# Patient Record
Sex: Male | Born: 1970 | Race: White | Hispanic: No | Smoking: Current every day smoker
Health system: Southern US, Community
[De-identification: ages and names within clinical notes are randomized; demographics above are authoritative.]

## PROBLEM LIST (undated history)

## (undated) DIAGNOSIS — K5900 Constipation, unspecified: Secondary | ICD-10-CM

## (undated) DIAGNOSIS — R011 Cardiac murmur, unspecified: Secondary | ICD-10-CM

## (undated) DIAGNOSIS — J45909 Unspecified asthma, uncomplicated: Secondary | ICD-10-CM

## (undated) DIAGNOSIS — E039 Hypothyroidism, unspecified: Secondary | ICD-10-CM

## (undated) DIAGNOSIS — E079 Disorder of thyroid, unspecified: Secondary | ICD-10-CM

## (undated) DIAGNOSIS — R5383 Other fatigue: Secondary | ICD-10-CM

## (undated) DIAGNOSIS — G709 Myoneural disorder, unspecified: Secondary | ICD-10-CM

## (undated) DIAGNOSIS — F32A Depression, unspecified: Secondary | ICD-10-CM

## (undated) DIAGNOSIS — G4730 Sleep apnea, unspecified: Secondary | ICD-10-CM

## (undated) DIAGNOSIS — I341 Nonrheumatic mitral (valve) prolapse: Secondary | ICD-10-CM

## (undated) DIAGNOSIS — I10 Essential (primary) hypertension: Secondary | ICD-10-CM

## (undated) DIAGNOSIS — K469 Unspecified abdominal hernia without obstruction or gangrene: Secondary | ICD-10-CM

## (undated) DIAGNOSIS — E785 Hyperlipidemia, unspecified: Secondary | ICD-10-CM

## (undated) DIAGNOSIS — F17200 Nicotine dependence, unspecified, uncomplicated: Secondary | ICD-10-CM

## (undated) HISTORY — DX: Nicotine dependence, unspecified, uncomplicated: F17.200

## (undated) HISTORY — DX: Hyperlipidemia, unspecified: E78.5

## (undated) HISTORY — DX: Constipation, unspecified: K59.00

## (undated) HISTORY — DX: Unspecified asthma, uncomplicated: J45.909

## (undated) HISTORY — DX: Myoneural disorder, unspecified: G70.9

## (undated) HISTORY — DX: Other fatigue: R53.83

## (undated) HISTORY — PX: BRAIN SURGERY: SHX531

## (undated) HISTORY — DX: Essential (primary) hypertension: I10

## (undated) HISTORY — DX: Depression, unspecified: F32.A

## (undated) HISTORY — DX: Disorder of thyroid, unspecified: E07.9

## (undated) HISTORY — DX: Hypothyroidism, unspecified: E03.9

## (undated) HISTORY — DX: Sleep apnea, unspecified: G47.30

## (undated) HISTORY — PX: COLONOSCOPY: SHX174

## (undated) HISTORY — DX: Nonrheumatic mitral (valve) prolapse: I34.1

## (undated) HISTORY — DX: Cardiac murmur, unspecified: R01.1

---
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
## 2006-02-06 DIAGNOSIS — K469 Unspecified abdominal hernia without obstruction or gangrene: Secondary | ICD-10-CM

## 2006-02-06 HISTORY — DX: Unspecified abdominal hernia without obstruction or gangrene: K46.9

## 2010-03-24 ENCOUNTER — Emergency Department (HOSPITAL_COMMUNITY)
Admission: EM | Admit: 2010-03-24 | Discharge: 2010-03-24 | Disposition: A | Discharge status: Home / Self Care | Payer: No Typology Code available for payment source | Attending: Emergency Medicine | Admitting: Emergency Medicine

## 2010-03-24 ENCOUNTER — Emergency Department (HOSPITAL_COMMUNITY): Payer: No Typology Code available for payment source

## 2010-03-24 DIAGNOSIS — E039 Hypothyroidism, unspecified: Secondary | ICD-10-CM | POA: Insufficient documentation

## 2010-03-24 DIAGNOSIS — R079 Chest pain, unspecified: Secondary | ICD-10-CM | POA: Insufficient documentation

## 2010-03-24 DIAGNOSIS — F17200 Nicotine dependence, unspecified, uncomplicated: Secondary | ICD-10-CM | POA: Insufficient documentation

## 2010-03-24 DIAGNOSIS — S20219A Contusion of unspecified front wall of thorax, initial encounter: Secondary | ICD-10-CM | POA: Insufficient documentation

## 2013-02-26 DIAGNOSIS — G4733 Obstructive sleep apnea (adult) (pediatric): Secondary | ICD-10-CM | POA: Insufficient documentation

## 2013-02-26 DIAGNOSIS — I10 Essential (primary) hypertension: Secondary | ICD-10-CM | POA: Insufficient documentation

## 2013-02-26 DIAGNOSIS — E063 Autoimmune thyroiditis: Secondary | ICD-10-CM | POA: Insufficient documentation

## 2013-06-02 DIAGNOSIS — M5430 Sciatica, unspecified side: Secondary | ICD-10-CM | POA: Insufficient documentation

## 2015-02-07 HISTORY — PX: BACK SURGERY: SHX140

## 2016-01-26 ENCOUNTER — Ambulatory Visit (INDEPENDENT_AMBULATORY_CARE_PROVIDER_SITE_OTHER): Admitting: Orthopedic Surgery

## 2016-01-26 ENCOUNTER — Encounter (INDEPENDENT_AMBULATORY_CARE_PROVIDER_SITE_OTHER): Payer: Self-pay | Admitting: Orthopedic Surgery

## 2016-01-26 VITALS — Ht 69.0 in | Wt 236.0 lb

## 2016-01-26 DIAGNOSIS — S86111A Strain of other muscle(s) and tendon(s) of posterior muscle group at lower leg level, right leg, initial encounter: Secondary | ICD-10-CM | POA: Diagnosis not present

## 2016-01-26 NOTE — Progress Notes (Signed)
   Office Visit Note   Patient: Reginald Salazar           Date of Birth: 04/12/1970           MRN: [id number] Visit Date: 01/26/2016              Requested by: Jaynee Eagles, NP [address], [place] [address] PCP: No primary care provider on file.   Assessment & Plan: Visit Diagnoses:  1. Gastrocnemius muscle strain, right, initial encounter     Plan: Patient's Achilles is intact the gastrocnemius muscle was asymptomatic. Patient was given instructions for heel cord stretching strengthening increase his activities as tolerated over the next month.  Follow-Up Instructions: Return if symptoms worsen or fail to improve.   Orders:  No orders of the defined types were placed in this encounter.  No orders of the defined types were placed in this encounter.     Procedures: No procedures performed   Clinical Data: No additional findings.   Subjective: No chief complaint on file.   Patient presents today for calf pain. Patient was getting object out of the back of his car, heard a pop in lower calf. DOI 12/23/15. He could not walk for two days after. He was told by medical doctor that he had a tear in his calf. He was feeling better but noticed a large indentation calf muscle, a week to 10 days injury post injury. He was told by his neurosurgeon Dr. Glenna Fellows that this injury was possibly achilles injury. Bruising down at ankle. He denies any history of diabetes.     Review of Systems  Patient is approximately 5 weeks status post sprain of the medial head gastrocnemius muscle on the right of note patient is status post lumbar spine surgery for right-sided radicular numbness and weakness. Objective: Vital Signs: Ht 5\' 9"  (1.753 m)   Wt 236 lb (107 kg)   BMI 34.85 kg/m   Physical Exam examination patient is alert oriented no adenopathy well-dressed normal affect normal respiratory effort he has a normal gait he has a good dorsalis pedis and posterior tibial  pulse he has good dorsiflexion of the ankle. The Achilles tendon is palpated and is intact. The gastrocnemius muscle is nontender to palpation no palpable defect.  Ortho Exam  Specialty Comments:  No specialty comments available.  Imaging: No results found.   PMFS History: Patient Active Problem List   Diagnosis Date Noted  . Gastrocnemius muscle strain, right, initial encounter 01/26/2016   History reviewed. No pertinent past medical history.  History reviewed. No pertinent family history.  History reviewed. No pertinent surgical history. Social History   Occupational History  . Not on file.   Social History Main Topics  . Smoking status: Current Every Day Smoker    Years: 30.00    Types: Cigarettes  . Smokeless tobacco: Never Used  . Alcohol use Not on file  . Drug use: Unknown  . Sexual activity: Not on file

## 2016-04-18 DIAGNOSIS — E782 Mixed hyperlipidemia: Secondary | ICD-10-CM | POA: Insufficient documentation

## 2016-04-18 DIAGNOSIS — E039 Hypothyroidism, unspecified: Secondary | ICD-10-CM | POA: Insufficient documentation

## 2016-04-18 DIAGNOSIS — K439 Ventral hernia without obstruction or gangrene: Secondary | ICD-10-CM | POA: Insufficient documentation

## 2016-11-10 DIAGNOSIS — M25532 Pain in left wrist: Secondary | ICD-10-CM | POA: Insufficient documentation

## 2017-03-20 DIAGNOSIS — Z789 Other specified health status: Secondary | ICD-10-CM | POA: Insufficient documentation

## 2017-05-24 DIAGNOSIS — M47816 Spondylosis without myelopathy or radiculopathy, lumbar region: Secondary | ICD-10-CM | POA: Insufficient documentation

## 2017-05-24 DIAGNOSIS — M5126 Other intervertebral disc displacement, lumbar region: Secondary | ICD-10-CM | POA: Insufficient documentation

## 2017-06-13 DIAGNOSIS — E041 Nontoxic single thyroid nodule: Secondary | ICD-10-CM | POA: Insufficient documentation

## 2017-12-11 ENCOUNTER — Ambulatory Visit (INDEPENDENT_AMBULATORY_CARE_PROVIDER_SITE_OTHER): Admitting: Family Medicine

## 2017-12-11 ENCOUNTER — Encounter: Payer: Self-pay | Admitting: Family Medicine

## 2017-12-11 DIAGNOSIS — I10 Essential (primary) hypertension: Secondary | ICD-10-CM

## 2017-12-11 DIAGNOSIS — E038 Other specified hypothyroidism: Secondary | ICD-10-CM

## 2017-12-11 DIAGNOSIS — M47816 Spondylosis without myelopathy or radiculopathy, lumbar region: Secondary | ICD-10-CM | POA: Diagnosis not present

## 2017-12-11 DIAGNOSIS — E063 Autoimmune thyroiditis: Secondary | ICD-10-CM

## 2017-12-11 MED ORDER — LISINOPRIL 10 MG PO TABS: 10.0000 mg | ORAL_TABLET | Freq: Every day | ORAL | 3 refills | Status: DC

## 2017-12-11 NOTE — Assessment & Plan Note (Signed)
-  Doing well with current dose of levothyroxine -Continue and recheck in in about 4 months at CPE

## 2017-12-11 NOTE — Assessment & Plan Note (Signed)
-  BP fairly well controlled, continue current medication. Refill provided.  -Unfortunately continues to smoke as well, no interest in quitting at this time.  -Follow low sodium diet.  -Encouraged continued weight loss.

## 2017-12-11 NOTE — Patient Instructions (Addendum)
Continue medication at current dose.  I'll see you back in Feb/March

## 2017-12-11 NOTE — Assessment & Plan Note (Signed)
-  Followed by neurosurgery -Using gabapentin/percocet as needed.

## 2017-12-11 NOTE — Progress Notes (Signed)
Reginald Salazar - 47 y.o. male MRN [id number]  Date of birth: [birth year]  Subjective Chief Complaint  Patient presents with  . Medication Refill    HPI Reginald Salazar is a 47 y.o. 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  He is familiar to me from my previous practice.    -Hypothyroidism:  Diagnosed with hypothyroidism ~20 years ago, 2/2 to [place].  Has been doing fairly well up until a few months ago when started feeling a little "jittery".   Followed up with pcp at the time and TSH was suppressed.  He was referred to endocrinology and dose of medication was decreased from 253mcg daily to 125mcg daily with 1/2 tab on Sunday.  He reports that he has continued on 152mcg daily without doing a 1/2 tab on Sunday.  His recent TSH was WNL.  He has been working on weight loss using Keto diet and was told this was contributing to his fluctuating levels.  He feels well on current dose.   -HTN:  Current tx with lisinopril which has worked well for him.  Weight loss has helped keep his BP under better control as well.  He denies side effects including symptoms of hypotension.  He denies anginal symptoms, headache, vision change.    -Low back pain: History of lumbar laminectomy with fusion.  Still gets pain from time to time and uses gabapentin and percocet as needed.  Denies daily opioid use.  He continues to have numbness in his R leg but no significant loss of strength.   ROS:  A comprehensive ROS was completed and negative except as noted per HPI  No Known Allergies  Past Medical History:  Diagnosis Date  . Hypertension   . Hypothyroidism   . Sleep apnea   . Thyroid disease     Past Surgical History:  Procedure Laterality Date  . BRAIN SURGERY      Social History   Socioeconomic History  . Marital status: Single    Spouse name: Not on file  . Number of children: Not on file  . Years of  education: Not on file  . Highest education level: Not on file  Occupational History  . Not on file  Social Needs  . Financial resource strain: Not on file  . Food insecurity:    Worry: Not on file    Inability: Not on file  . Transportation needs:    Medical: Not on file    Non-medical: Not on file  Tobacco Use  . Smoking status: Current Every Day Smoker    Years: 30.00    Types: Cigarettes  . Smokeless tobacco: Never Used  Substance and Sexual Activity  . Alcohol use: Yes    Comment: rarely   . Drug use: Yes    Types: Marijuana  . Sexual activity: Not on file  Lifestyle  . Physical activity:    Days per week: Not on file    Minutes per session: Not on file  . Stress: Not on file  Relationships  . Social connections:    Talks on phone: Not on file    Gets together: Not on file    Attends religious service: Not on file    Active member of club or organization: Not on file    Attends meetings of clubs or organizations: Not on file    Relationship status: Not on file  Other Topics Concern  . Not  on file  Social History Narrative  . Not on file    Family History  Problem Relation Age of Onset  . Rheum arthritis Mother   . Mitral valve prolapse Mother     Health Maintenance  Topic Date Due  . Samul Dada  08/02/1987  . HIV Screening  12/12/2018 (Originally 08/02/1983)  . INFLUENZA VACCINE  12/26/2018 (Originally 09/06/2017)    ----------------------------------------------------------------------------------------------------------------------------------------------------------------------------------------------------------------- Physical Exam BP 130/90   Pulse 74   Temp 97.9 F (36.6 C) (Oral)   Ht 5\' 9"  (1.753 m)   Wt 233 lb (105.7 kg)   SpO2 97%   BMI 34.41 kg/m   Physical Exam  Constitutional: He is oriented to person, place, and time. He appears well-nourished. No distress.  HENT:  Head: Normocephalic and atraumatic.  Mouth/Throat: Oropharynx  is clear and moist.  Eyes: Conjunctivae are normal. No scleral icterus.  Neck: Neck supple. No thyromegaly present.  Cardiovascular: Normal rate, regular rhythm and normal heart sounds.  Pulmonary/Chest: Effort normal and breath sounds normal.  Lymphadenopathy:    He has no cervical adenopathy.  Neurological: He is alert and oriented to person, place, and time.  Skin: Skin is warm and dry.  Psychiatric: He has a normal mood and affect. His behavior is normal.    ------------------------------------------------------------------------------------------------------------------------------------------------------------------------------------------------------------------- Assessment and Plan  Hypertension -BP fairly well controlled, continue current medication. Refill provided.  -Unfortunately continues to smoke as well, no interest in quitting at this time.  -Follow low sodium diet.  -Encouraged continued weight loss.    Hypothyroidism -Doing well with current dose of levothyroxine -Continue and recheck in in about 4 months at CPE  Lumbar spondylosis -Followed by neurosurgery -Using gabapentin/percocet as needed.

## 2018-02-22 ENCOUNTER — Telehealth: Payer: Self-pay | Admitting: *Deleted

## 2018-02-22 NOTE — Telephone Encounter (Signed)
If [facility] is requesting a face to face and notes, he'll need to have an OV.

## 2018-02-22 NOTE — Telephone Encounter (Signed)
Spoke with patient-his machine has not been working properly.  CPAP machine pressure of 9 cm, verfied with patient   Copied from [place] #[id number]. Topic: General - Other >> Feb 22, 2018 11:51 AM Oneta Rack wrote:  Relation to pt: self  Call back number: [phone]  Reason for call:   Patient states C Pap machine is 51 yrs old and would like PCP to fax orders over to [facility] phone [phone] and fax # [phone]. [facility] requesting copy of face to face visit notes and new Rx reflecting why a new one is needed. Please advise patient directly

## 2018-02-22 NOTE — Telephone Encounter (Addendum)
Informed patient as directed-appointment scheduled for 03/04/2018.

## 2018-03-04 ENCOUNTER — Ambulatory Visit (INDEPENDENT_AMBULATORY_CARE_PROVIDER_SITE_OTHER): Admitting: Family Medicine

## 2018-03-04 ENCOUNTER — Encounter: Payer: Self-pay | Admitting: Family Medicine

## 2018-03-04 DIAGNOSIS — G4733 Obstructive sleep apnea (adult) (pediatric): Secondary | ICD-10-CM | POA: Diagnosis not present

## 2018-03-04 NOTE — Progress Notes (Signed)
Reginald Salazar - 48 y.o. male MRN [id number]  Date of birth: 1970-04-20  Subjective Chief Complaint  Patient presents with  . Other    Needs new CPAP machine    HPI Reginald Salazar is a 48 y.o. male with history of hypothyroidis, hypertension, and OSA here today for follow up of OSA.  He reports that his current machine is non-functioning and he needs rx for new machine.  When using machine he was using with pressure of 9 cmH20.  He has been using a [employer] nasal mask which works well for him.  He reports he is not sleeping well without having a functioning CPAP machine.  He denies headache, chest pain or shortness of breath.   ROS:  A comprehensive ROS was completed and negative except as noted per HPI   No Known Allergies  Past Medical History:  Diagnosis Date  . Hypertension   . Hypothyroidism   . Sleep apnea   . Thyroid disease     Past Surgical History:  Procedure Laterality Date  . BRAIN SURGERY      Social History   Socioeconomic History  . Marital status: Single    Spouse name: Not on file  . Number of children: Not on file  . Years of education: Not on file  . Highest education level: Not on file  Occupational History  . Not on file  Social Needs  . Financial resource strain: Not on file  . Food insecurity:    Worry: Not on file    Inability: Not on file  . Transportation needs:    Medical: Not on file    Non-medical: Not on file  Tobacco Use  . Smoking status: Current Every Day Smoker    Years: 30.00    Types: Cigarettes  . Smokeless tobacco: Never Used  Substance and Sexual Activity  . Alcohol use: Yes    Comment: rarely   . Drug use: Yes    Types: Marijuana  . Sexual activity: Not on file  Lifestyle  . Physical activity:    Days per week: Not on file    Minutes per session: Not on file  . Stress: Not on file  Relationships  . Social connections:    Talks on phone: Not on file    Gets together: Not on file    Attends  religious service: Not on file    Active member of club or organization: Not on file    Attends meetings of clubs or organizations: Not on file    Relationship status: Not on file  Other Topics Concern  . Not on file  Social History Narrative  . Not on file    Family History  Problem Relation Age of Onset  . Rheum arthritis Mother   . Mitral valve prolapse Mother     Health Maintenance  Topic Date Due  . HIV Screening  12/12/2018 (Originally 08/02/1983)  . INFLUENZA VACCINE  12/26/2018 (Originally 09/06/2017)  . TETANUS/TDAP  03/21/2027    ----------------------------------------------------------------------------------------------------------------------------------------------------------------------------------------------------------------- Physical Exam BP 128/78   Pulse 74   Temp 98.1 F (36.7 C) (Oral)   Ht 5\' 9"  (1.753 m)   Wt 240 lb (108.9 kg)   SpO2 96%   BMI 35.44 kg/m   Physical Exam Constitutional:      Appearance: Normal appearance.  Cardiovascular:     Rate and Rhythm: Normal rate and regular rhythm.  Pulmonary:     Effort: Pulmonary effort is normal.     Breath sounds:  Normal breath sounds.  Skin:    General: Skin is warm and dry.     Findings: No rash.  Neurological:     General: No focal deficit present.     Mental Status: He is alert.  Psychiatric:        Mood and Affect: Mood normal.        Behavior: Behavior normal.     ------------------------------------------------------------------------------------------------------------------------------------------------------------------------------------------------------------------- Assessment and Plan  OSA (obstructive sleep apnea) -He has done very well with CPAP, however needs new machine/equipment.  -Recommend auto-titrate machine given fluctuating weight with range of 4cmH20-12cmH20.  -Mask per preference based on fit and comfort.  -Will follow up with compliance data after 30 days.

## 2018-03-04 NOTE — Assessment & Plan Note (Signed)
-  He has done very well with CPAP, however needs new machine/equipment.  -Recommend auto-titrate machine given fluctuating weight with range of 4cmH20-12cmH20.  -Mask per preference based on fit and comfort.  -Will follow up with compliance data after 30 days.

## 2018-03-13 ENCOUNTER — Encounter: Admitting: Family Medicine

## 2018-03-15 ENCOUNTER — Encounter: Admitting: Family Medicine

## 2018-03-27 ENCOUNTER — Ambulatory Visit (INDEPENDENT_AMBULATORY_CARE_PROVIDER_SITE_OTHER): Admitting: Family Medicine

## 2018-03-27 ENCOUNTER — Ambulatory Visit (INDEPENDENT_AMBULATORY_CARE_PROVIDER_SITE_OTHER)

## 2018-03-27 ENCOUNTER — Encounter: Payer: Self-pay | Admitting: Family Medicine

## 2018-03-27 VITALS — BP 142/83 | HR 73 | Temp 97.9°F | Ht 69.0 in | Wt 237.0 lb

## 2018-03-27 DIAGNOSIS — E063 Autoimmune thyroiditis: Secondary | ICD-10-CM | POA: Diagnosis not present

## 2018-03-27 DIAGNOSIS — E038 Other specified hypothyroidism: Secondary | ICD-10-CM | POA: Diagnosis not present

## 2018-03-27 DIAGNOSIS — K5900 Constipation, unspecified: Secondary | ICD-10-CM

## 2018-03-27 DIAGNOSIS — J40 Bronchitis, not specified as acute or chronic: Secondary | ICD-10-CM | POA: Diagnosis not present

## 2018-03-27 DIAGNOSIS — J42 Unspecified chronic bronchitis: Secondary | ICD-10-CM | POA: Insufficient documentation

## 2018-03-27 DIAGNOSIS — R0781 Pleurodynia: Secondary | ICD-10-CM | POA: Diagnosis not present

## 2018-03-27 LAB — T4, FREE: FREE T4: 1.06 ng/dL (ref 0.60–1.60)

## 2018-03-27 LAB — TSH: TSH: 3.66 u[IU]/mL (ref 0.35–4.50)

## 2018-03-27 MED ORDER — ALBUTEROL SULFATE HFA 108 (90 BASE) MCG/ACT IN AERS: 2.0000 | INHALATION_SPRAY | Freq: Four times a day (QID) | RESPIRATORY_TRACT | 0 refills | Status: DC | PRN

## 2018-03-27 NOTE — Assessment & Plan Note (Signed)
Update TSH and free t4

## 2018-03-27 NOTE — Assessment & Plan Note (Signed)
Xray wit R rib detail ordered.

## 2018-03-27 NOTE — Patient Instructions (Signed)
Use albuterol as needed for cough or wheezing We'll be in touch with results.

## 2018-03-27 NOTE — Assessment & Plan Note (Signed)
Improving, he'll continue to use albuterol as needed Counseled on smoking cessation.

## 2018-03-27 NOTE — Assessment & Plan Note (Signed)
-  Increase fluids -May use laxative PRN.  -KUB ordered.

## 2018-03-27 NOTE — Progress Notes (Signed)
Reginald Salazar - 48 y.o. male MRN [id number]  Date of birth: 1970/04/04  Subjective Chief Complaint  Patient presents with  . Follow-up    thyroid  . Cough    ongoing for one week-has been having right lateral pain    HPI Reginald Salazar is a 48 y.o. male here today with complaint of R rib pain.  He reports that he had cold last week with severe episode of coughing.  States that he was coughing one evening and felt a sharp pain in his R rib area.  This has continued every time he coughs or laughs now.  It is a little better but still present.  He also reports some issues with constipation.  He has tried dulcolax and enema and gotten some improvement.   He is concerned because his BM are usually fairly regular.  He does report using a lot of OTC decongestant type medications for his cold that may be contributing.   He would like to have his thyroid checked as well.   ROS:  A comprehensive ROS was completed and negative except as noted per HPI  No Known Allergies  Past Medical History:  Diagnosis Date  . Hypertension   . Hypothyroidism   . Sleep apnea   . Thyroid disease     Past Surgical History:  Procedure Laterality Date  . BRAIN SURGERY      Social History   Socioeconomic History  . Marital status: Single    Spouse name: Not on file  . Number of children: Not on file  . Years of education: Not on file  . Highest education level: Not on file  Occupational History  . Not on file  Social Needs  . Financial resource strain: Not on file  . Food insecurity:    Worry: Not on file    Inability: Not on file  . Transportation needs:    Medical: Not on file    Non-medical: Not on file  Tobacco Use  . Smoking status: Current Every Day Smoker    Years: 30.00    Types: Cigarettes  . Smokeless tobacco: Never Used  Substance and Sexual Activity  . Alcohol use: Yes    Comment: rarely   . Drug use: Yes    Types: Marijuana  . Sexual activity: Not on file  Lifestyle  .  Physical activity:    Days per week: Not on file    Minutes per session: Not on file  . Stress: Not on file  Relationships  . Social connections:    Talks on phone: Not on file    Gets together: Not on file    Attends religious service: Not on file    Active member of club or organization: Not on file    Attends meetings of clubs or organizations: Not on file    Relationship status: Not on file  Other Topics Concern  . Not on file  Social History Narrative  . Not on file    Family History  Problem Relation Age of Onset  . Rheum arthritis Mother   . Mitral valve prolapse Mother     Health Maintenance  Topic Date Due  . HIV Screening  12/12/2018 (Originally 08/02/1983)  . INFLUENZA VACCINE  12/26/2018 (Originally 09/06/2017)  . TETANUS/TDAP  03/21/2027    ----------------------------------------------------------------------------------------------------------------------------------------------------------------------------------------------------------------- Physical Exam BP (!) 142/83   Pulse 73   Temp 97.9 F (36.6 C) (Oral)   Ht 5\' 9"  (1.753 m)   Wt 237 lb (107.5 kg)  SpO2 95%   BMI 35.00 kg/m   Physical Exam Constitutional:      Appearance: Normal appearance.  HENT:     Head: Normocephalic and atraumatic.     Right Ear: Tympanic membrane normal.     Left Ear: Tympanic membrane normal.     Mouth/Throat:     Mouth: Mucous membranes are moist.  Eyes:     General: No scleral icterus. Cardiovascular:     Rate and Rhythm: Normal rate and regular rhythm.  Pulmonary:     Effort: Pulmonary effort is normal.     Breath sounds: Normal breath sounds.  Chest:     Chest wall: Tenderness (TTP along R lower ribs) present.  Abdominal:     General: Abdomen is flat. Bowel sounds are normal. There is no distension.     Tenderness: There is no abdominal tenderness.  Skin:    General: Skin is warm and dry.     Findings: No rash.  Neurological:     General: No focal  deficit present.     Mental Status: He is alert.  Psychiatric:        Mood and Affect: Mood normal.        Behavior: Behavior normal.     ------------------------------------------------------------------------------------------------------------------------------------------------------------------------------------------------------------------- Assessment and Plan  Bronchitis Improving, he'll continue to use albuterol as needed Counseled on smoking cessation.   Hypothyroidism Update TSH and free t4  Rib pain on right side Xray wit R rib detail ordered.   Constipation -Increase fluids -May use laxative PRN.  -KUB ordered.

## 2018-05-09 ENCOUNTER — Encounter: Payer: Self-pay | Admitting: Family Medicine

## 2018-05-10 NOTE — Telephone Encounter (Signed)
Ok to schedule for webex.

## 2018-05-22 NOTE — Telephone Encounter (Signed)
Please see message under father's chart.

## 2018-05-27 ENCOUNTER — Encounter: Payer: Self-pay | Admitting: Family Medicine

## 2018-05-27 NOTE — Telephone Encounter (Signed)
HH orders entered for PT/OT/Nursing for father under his chart.  Please have him set up separate MyChart for his father's account as well.

## 2018-05-30 ENCOUNTER — Encounter: Payer: Self-pay | Admitting: Family Medicine

## 2018-05-30 DIAGNOSIS — E038 Other specified hypothyroidism: Secondary | ICD-10-CM

## 2018-05-30 DIAGNOSIS — E063 Autoimmune thyroiditis: Principal | ICD-10-CM

## 2018-05-31 MED ORDER — LEVOTHYROXINE SODIUM 175 MCG PO TABS: ORAL_TABLET | ORAL | 2 refills | Status: DC

## 2018-05-31 NOTE — Telephone Encounter (Signed)
Ok to send

## 2018-05-31 NOTE — Telephone Encounter (Signed)
Pt sent MyChart message to request Rx Levothyroxine 175Mcmg. Rx sent to [facility]. Recent labs on 03/28/2018.

## 2018-05-31 NOTE — Telephone Encounter (Signed)
Sent encounter to Dr. Zigmund Daniel for review.

## 2018-06-10 ENCOUNTER — Encounter: Payer: Self-pay | Admitting: Family Medicine

## 2018-06-10 DIAGNOSIS — E038 Other specified hypothyroidism: Secondary | ICD-10-CM

## 2018-06-10 DIAGNOSIS — E063 Autoimmune thyroiditis: Principal | ICD-10-CM

## 2018-06-11 MED ORDER — SYNTHROID 175 MCG PO TABS: ORAL_TABLET | ORAL | 2 refills | Status: DC

## 2018-06-11 NOTE — Telephone Encounter (Signed)
Pt called stated he got his Rx Levothyroxine, However , the generic , Levothyroxine causes hair loss. Pt requested Name brand. Rx sent.

## 2018-08-16 ENCOUNTER — Other Ambulatory Visit: Payer: Self-pay | Admitting: Family Medicine

## 2018-08-22 ENCOUNTER — Encounter: Payer: Self-pay | Admitting: Family Medicine

## 2018-08-22 ENCOUNTER — Other Ambulatory Visit: Payer: Self-pay

## 2018-08-22 ENCOUNTER — Telehealth (INDEPENDENT_AMBULATORY_CARE_PROVIDER_SITE_OTHER): Admitting: Family Medicine

## 2018-08-22 VITALS — Ht 69.0 in

## 2018-08-22 DIAGNOSIS — Z13220 Encounter for screening for lipoid disorders: Secondary | ICD-10-CM

## 2018-08-22 DIAGNOSIS — M47816 Spondylosis without myelopathy or radiculopathy, lumbar region: Secondary | ICD-10-CM | POA: Diagnosis not present

## 2018-08-22 DIAGNOSIS — E038 Other specified hypothyroidism: Secondary | ICD-10-CM | POA: Diagnosis not present

## 2018-08-22 DIAGNOSIS — I10 Essential (primary) hypertension: Secondary | ICD-10-CM | POA: Diagnosis not present

## 2018-08-22 DIAGNOSIS — Z79899 Other long term (current) drug therapy: Secondary | ICD-10-CM

## 2018-08-22 DIAGNOSIS — E063 Autoimmune thyroiditis: Secondary | ICD-10-CM

## 2018-08-22 NOTE — Assessment & Plan Note (Signed)
-  He will continue on percocet for management of post laminectomy pain.  Will have him complete controlled substance contract and obtain uds at when in for labs next week.

## 2018-08-22 NOTE — Assessment & Plan Note (Signed)
BP has remained well controlled with lisinopril.  Will plan to continue at current dose and update labs.

## 2018-08-22 NOTE — Progress Notes (Signed)
Reginald Salazar - 48 y.o. male MRN [id number]  Date of birth: [birth year]   This visit type was conducted due to national recommendations for restrictions regarding the COVID-19 Pandemic (e.g. social distancing).  This format is felt to be most appropriate for this patient at this time.  All issues noted in this document were discussed and addressed.  No physical exam was performed (except for noted visual exam findings with Video Visits).  I discussed the limitations of evaluation and management by telemedicine and the availability of in person appointments. The patient expressed understanding and agreed to proceed.  I connected with@ on 08/22/18 at 10:30 AM EDT by a video enabled telemedicine application and verified that I am speaking with the correct person using two identifiers.   Patient Location: Home [place] [address]   Provider location:   Home office  Chief Complaint  Patient presents with  . Back Pain    discuss pain management--lower back pain,had back surgery 3 yrs ago--neurology doctor retired.     HPI  Reginald Salazar is a 48 y.o. male who presents via audio/video conferencing for a telehealth visit today.  He has a history of HTN, hypothyroidism and chronic low back pain.  He reports that his neurosurgeon (Dr. Carloyn Manner) recently retired.  He was being treated for continued post laminectomy pain by his neurosurgeon with percocet 5/325mg  every 4-6 hours as needed.  He reports he has been doing well with this.  He denies side effects from this medication including constipation.  He would like for me to manage this since Dr. Carloyn Manner has retired.  He has tried gabapentin in the past but did not like the side effects associated with this.  Lyrica was not covered under his insurance previously but he would be willing to see if it may be covered in the future.   ROS:  A comprehensive ROS was completed and negative except as noted per HPI  Past Medical History:   Diagnosis Date  . Hypertension   . Hypothyroidism   . Sleep apnea   . Thyroid disease     Past Surgical History:  Procedure Laterality Date  . BRAIN SURGERY      Family History  Problem Relation Age of Onset  . Rheum arthritis Mother   . Mitral valve prolapse Mother     Social History   Socioeconomic History  . Marital status: Single    Spouse name: Not on file  . Number of children: Not on file  . Years of education: Not on file  . Highest education level: Not on file  Occupational History  . Not on file  Social Needs  . Financial resource strain: Not on file  . Food insecurity    Worry: Not on file    Inability: Not on file  . Transportation needs    Medical: Not on file    Non-medical: Not on file  Tobacco Use  . Smoking status: Current Every Day Smoker    Years: 30.00    Types: Cigarettes  . Smokeless tobacco: Never Used  Substance and Sexual Activity  . Alcohol use: Yes    Comment: rarely   . Drug use: Yes    Types: Marijuana  . Sexual activity: Not on file  Lifestyle  . Physical activity    Days per week: Not on file    Minutes per session: Not on file  . Stress: Not on file  Relationships  . Social connections    Talks  on phone: Not on file    Gets together: Not on file    Attends religious service: Not on file    Active member of club or organization: Not on file    Attends meetings of clubs or organizations: Not on file    Relationship status: Not on file  . Intimate partner violence    Fear of current or ex partner: Not on file    Emotionally abused: Not on file    Physically abused: Not on file    Forced sexual activity: Not on file  Other Topics Concern  . Not on file  Social History Narrative  . Not on file     Current Outpatient Medications:  .  albuterol (VENTOLIN HFA) 108 (90 Base) MCG/ACT inhaler, INHALE 2 PUFFS INTO THE LUNGS EVERY 6 HOURS AS NEEDED FOR WHEEZING OR SHORTNESS OF BREATH, Disp: 6.7 g, Rfl: 3 .  ibuprofen  (ADVIL,MOTRIN) 600 MG tablet, , Disp: , Rfl:  .  lisinopril (PRINIVIL,ZESTRIL) 10 MG tablet, Take 1 tablet (10 mg total) by mouth daily., Disp: 90 tablet, Rfl: 3 .  Misc. Devices MISC, Provide CPAP machine with pressure of 7 cm Diagnosis of OSA made 07/06/2009, Disp: , Rfl:  .  oxyCODONE-acetaminophen (PERCOCET/ROXICET) 5-325 MG tablet, , Disp: , Rfl:  .  SYNTHROID 175 MCG tablet, Take #1/2 tablet on Saturday and #1 tablet Sunday-Friday., Disp: 90 tablet, Rfl: 2  EXAM:  VITALS per patient if applicable: Ht 5\' 9"  (1.753 m)   BMI 35.00 kg/m   GENERAL: alert, oriented, appears well and in no acute distress  HEENT: atraumatic, conjunttiva clear, no obvious abnormalities on inspection of external nose and ears  NECK: normal movements of the head and neck  LUNGS: on inspection no signs of respiratory distress, breathing rate appears normal, no obvious gross SOB, gasping or wheezing  CV: no obvious cyanosis  MS: moves all visible extremities without noticeable abnormality  PSYCH/NEURO: pleasant and cooperative, no obvious depression or anxiety, speech and thought processing grossly intact  ASSESSMENT AND PLAN:  Discussed the following assessment and plan:  Hypothyroidism -Update TSH  Hypertension BP has remained well controlled with lisinopril.  Will plan to continue at current dose and update labs.    Lumbar spondylosis -He will continue on percocet for management of post laminectomy pain.  Will have him complete controlled substance contract and obtain uds at when in for labs next week.         I discussed the assessment and treatment plan with the patient. The patient was provided an opportunity to ask questions and all were answered. The patient agreed with the plan and demonstrated an understanding of the instructions.   The patient was advised to call back or seek an in-person evaluation if the symptoms worsen or if the condition fails to improve as anticipated.    Luetta Nutting, DO

## 2018-08-22 NOTE — Assessment & Plan Note (Signed)
Update TSH

## 2018-08-26 ENCOUNTER — Other Ambulatory Visit (INDEPENDENT_AMBULATORY_CARE_PROVIDER_SITE_OTHER)

## 2018-08-26 DIAGNOSIS — I10 Essential (primary) hypertension: Secondary | ICD-10-CM

## 2018-08-26 DIAGNOSIS — E063 Autoimmune thyroiditis: Secondary | ICD-10-CM

## 2018-08-26 DIAGNOSIS — M47816 Spondylosis without myelopathy or radiculopathy, lumbar region: Secondary | ICD-10-CM

## 2018-08-26 DIAGNOSIS — E038 Other specified hypothyroidism: Secondary | ICD-10-CM

## 2018-08-26 DIAGNOSIS — Z79899 Other long term (current) drug therapy: Secondary | ICD-10-CM

## 2018-08-26 DIAGNOSIS — Z13220 Encounter for screening for lipoid disorders: Secondary | ICD-10-CM

## 2018-08-26 NOTE — Addendum Note (Signed)
Addended by: Lynnea Ferrier on: 08/26/2018 11:59 AM   Modules accepted: Orders

## 2018-08-27 LAB — LIPID PANEL
Chol/HDL Ratio: 6.1 ratio — ABNORMAL HIGH (ref 0.0–5.0)
Cholesterol, Total: 249 mg/dL — ABNORMAL HIGH (ref 100–199)
HDL: 41 mg/dL (ref >39)
LDL Calculated: 141 mg/dL — ABNORMAL HIGH (ref 0–99)
Triglycerides: 336 mg/dL — ABNORMAL HIGH (ref 0–149)
VLDL Cholesterol Cal: 67 mg/dL — ABNORMAL HIGH (ref 5–40)

## 2018-08-27 LAB — BASIC METABOLIC PANEL
BUN/Creatinine Ratio: 11 (ref 9–20)
BUN: 11 mg/dL (ref 6–24)
CO2: 22 mmol/L (ref 20–29)
Calcium: 9.5 mg/dL (ref 8.7–10.2)
Chloride: 101 mmol/L (ref 96–106)
Creatinine, Ser: 0.97 mg/dL (ref 0.76–1.27)
GFR calc Af Amer: 105 mL/min/{1.73_m2} (ref >59)
GFR calc non Af Amer: 91 mL/min/{1.73_m2} (ref >59)
Glucose: 101 mg/dL — ABNORMAL HIGH (ref 65–99)
Potassium: 4.8 mmol/L (ref 3.5–5.2)
Sodium: 142 mmol/L (ref 134–144)

## 2018-08-27 LAB — T4, FREE: Free T4: 1.53 ng/dL (ref 0.82–1.77)

## 2018-08-27 LAB — TSH: TSH: 2.71 u[IU]/mL (ref 0.450–4.500)

## 2018-08-28 LAB — TOXASSURE SELECT 13 (MW), URINE

## 2018-09-03 ENCOUNTER — Encounter: Payer: Self-pay | Admitting: Family Medicine

## 2018-09-03 ENCOUNTER — Other Ambulatory Visit: Payer: Self-pay | Admitting: Family Medicine

## 2018-09-03 MED ORDER — OXYCODONE-ACETAMINOPHEN 5-325 MG PO TABS: 1.0000 | ORAL_TABLET | Freq: Four times a day (QID) | ORAL | 0 refills | Status: DC | PRN

## 2018-09-04 ENCOUNTER — Other Ambulatory Visit: Payer: Self-pay | Admitting: Family Medicine

## 2018-09-04 MED ORDER — ATORVASTATIN CALCIUM 20 MG PO TABS: 20.0000 mg | ORAL_TABLET | Freq: Every day | ORAL | 3 refills | Status: DC

## 2018-09-04 NOTE — Telephone Encounter (Signed)
Completed.

## 2018-10-10 ENCOUNTER — Encounter: Payer: Self-pay | Admitting: Family Medicine

## 2018-10-11 ENCOUNTER — Other Ambulatory Visit: Payer: Self-pay | Admitting: Family Medicine

## 2018-10-11 DIAGNOSIS — Z1211 Encounter for screening for malignant neoplasm of colon: Secondary | ICD-10-CM

## 2018-10-11 MED ORDER — OXYCODONE-ACETAMINOPHEN 5-325 MG PO TABS: 1.0000 | ORAL_TABLET | Freq: Four times a day (QID) | ORAL | 0 refills | Status: DC | PRN

## 2018-10-22 ENCOUNTER — Encounter: Payer: Self-pay | Admitting: Gastroenterology

## 2018-11-06 ENCOUNTER — Ambulatory Visit (AMBULATORY_SURGERY_CENTER): Payer: Self-pay | Admitting: *Deleted

## 2018-11-06 ENCOUNTER — Other Ambulatory Visit: Payer: Self-pay

## 2018-11-06 VITALS — Temp 97.1°F | Ht 69.5 in | Wt 224.8 lb

## 2018-11-06 DIAGNOSIS — Z1211 Encounter for screening for malignant neoplasm of colon: Secondary | ICD-10-CM

## 2018-11-06 MED ORDER — NA SULFATE-K SULFATE-MG SULF 17.5-3.13-1.6 GM/177ML PO SOLN
ORAL | 0 refills | Status: DC
Start: 2018-11-06 — End: 2018-11-20

## 2018-11-06 NOTE — Progress Notes (Signed)
Patient is here in-person for PV. Patient denies any allergies to eggs or soy. Patient denies any problems with anesthesia/sedation. Patient denies any oxygen use at home. Patient denies taking any diet/weight loss medications or blood thinners. Patient is not being treated for MRSA or C-diff. EMMI education assisgned to patient on colonoscopy, this was explained and instructions given to patient. Suprep 15 off coupon given to pt.   Pt is aware that care partner will wait in the car during procedure; if they feel like they will be too hot to wait in the car; they may wait in the lobby. Patient is aware to bring only one care partner. We want them to wear a mask (we do not have any that we can provide them), practice social distancing, and we will check their temperatures when they get here.  I did remind patient that their care partner needs to stay in the parking lot the entire time. Pt will wear mask into building.

## 2018-11-11 ENCOUNTER — Ambulatory Visit (INDEPENDENT_AMBULATORY_CARE_PROVIDER_SITE_OTHER)

## 2018-11-11 ENCOUNTER — Encounter: Payer: Self-pay | Admitting: Family Medicine

## 2018-11-11 ENCOUNTER — Ambulatory Visit (INDEPENDENT_AMBULATORY_CARE_PROVIDER_SITE_OTHER): Admitting: Family Medicine

## 2018-11-11 ENCOUNTER — Other Ambulatory Visit: Payer: Self-pay

## 2018-11-11 VITALS — BP 140/88 | HR 88 | Temp 98.7°F | Resp 16 | Ht 69.5 in | Wt 225.6 lb

## 2018-11-11 DIAGNOSIS — E063 Autoimmune thyroiditis: Secondary | ICD-10-CM

## 2018-11-11 DIAGNOSIS — M79642 Pain in left hand: Secondary | ICD-10-CM | POA: Insufficient documentation

## 2018-11-11 DIAGNOSIS — E038 Other specified hypothyroidism: Secondary | ICD-10-CM

## 2018-11-11 MED ORDER — OXYCODONE-ACETAMINOPHEN 5-325 MG PO TABS: 1.0000 | ORAL_TABLET | Freq: Four times a day (QID) | ORAL | 0 refills | Status: DC | PRN

## 2018-11-11 MED ORDER — SYNTHROID 175 MCG PO TABS: ORAL_TABLET | ORAL | 2 refills | Status: DC

## 2018-11-11 MED ORDER — LISINOPRIL 10 MG PO TABS: 10.0000 mg | ORAL_TABLET | Freq: Every day | ORAL | 3 refills | Status: DC

## 2018-11-11 NOTE — Assessment & Plan Note (Addendum)
-  xrays ordered, if fx present will refer to hand specialist.  -If imaging is negative will treat conservatively.  Wrist brace may be helpful and recommend voltaren gel.

## 2018-11-11 NOTE — Progress Notes (Signed)
Reginald Salazar - 48 y.o. male MRN [id number]  Date of birth: 01-07-1971  Subjective Chief Complaint  Patient presents with  . Hand Pain    x wednesday, Left hand, no known injury, Noticed the pain when he plays golf   . blood work    unsure if he is due for blood work    HPI Reginald Salazar is a 48 y.o. male here today due to hand pain.   Since his last visit with me his wife has unfortunately passed away from cancer.  He reports he is doing "ok" and that every day gets a little better.  He has good support from friends.  He reports that he was golfing a few days ago and noticed increased pain in the back of the L hand.  Pain is worse when using driver.  He does not notice when at rest.   He did notice a little swelling initially. He had numbness/tingling or weakness of the hand.   ROS:  A comprehensive ROS was completed and negative except as noted per HPI  No Known Allergies  Past Medical History:  Diagnosis Date  . Hyperlipidemia   . Hypertension   . Hypothyroidism   . Sleep apnea    CPAP  . Smoker   . Thyroid disease     Past Surgical History:  Procedure Laterality Date  . BACK SURGERY  2017    Social History   Socioeconomic History  . Marital status: Widowed    Spouse name: Not on file  . Number of children: Not on file  . Years of education: Not on file  . Highest education level: Not on file  Occupational History  . Not on file  Social Needs  . Financial resource strain: Not on file  . Food insecurity    Worry: Not on file    Inability: Not on file  . Transportation needs    Medical: Not on file    Non-medical: Not on file  Tobacco Use  . Smoking status: Current Every Day Smoker    Packs/day: 0.75    Years: 30.00    Pack years: 22.50    Types: Cigarettes  . Smokeless tobacco: Never Used  Substance and Sexual Activity  . Alcohol use: Yes    Alcohol/week: 12.0 standard drinks    Types: 12 Cans of beer per week  . Drug use: Not Currently  .  Sexual activity: Not on file  Lifestyle  . Physical activity    Days per week: Not on file    Minutes per session: Not on file  . Stress: Not on file  Relationships  . Social Herbalist on phone: Not on file    Gets together: Not on file    Attends religious service: Not on file    Active member of club or organization: Not on file    Attends meetings of clubs or organizations: Not on file    Relationship status: Not on file  Other Topics Concern  . Not on file  Social History Narrative  . Not on file    Family History  Problem Relation Age of Onset  . Rheum arthritis Mother   . Mitral valve prolapse Mother   . Stomach cancer Maternal Grandmother   . Colon cancer Neg Hx   . Colon polyps Neg Hx   . Esophageal cancer Neg Hx   . Rectal cancer Neg Hx     Health Maintenance  Topic Date Due  .  COLONOSCOPY  08/02/2018  . HIV Screening  12/12/2018 (Originally 08/02/1983)  . INFLUENZA VACCINE  05/07/2019 (Originally 09/07/2018)  . TETANUS/TDAP  03/21/2027    ----------------------------------------------------------------------------------------------------------------------------------------------------------------------------------------------------------------- Physical Exam BP 140/88 (BP Location: Left Arm, Patient Position: Sitting, Cuff Size: Normal)   Pulse 88   Temp 98.7 F (37.1 C) (Oral)   Resp 16   Ht 5' 9.5" (1.765 m)   Wt 225 lb 9.6 oz (102.3 kg)   SpO2 95%   BMI 32.84 kg/m   Physical Exam Constitutional:      Appearance: Normal appearance.  HENT:     Head: Normocephalic and atraumatic.  Neck:     Musculoskeletal: Neck supple.  Cardiovascular:     Rate and Rhythm: Normal rate and regular rhythm.  Pulmonary:     Effort: Pulmonary effort is normal.     Breath sounds: Normal breath sounds.  Musculoskeletal:     Comments: Mild ttp along dorsum of L hand.  Pain with resisted extension of wrist and L index finger.  Normal strength and sensation.    Skin:    General: Skin is warm and dry.  Neurological:     General: No focal deficit present.  Psychiatric:        Mood and Affect: Mood normal.     ------------------------------------------------------------------------------------------------------------------------------------------------------------------------------------------------------------------- Assessment and Plan  Left hand pain -xrays ordered, if fx present will refer to hand specialist.  -If imaging is negative will treat conservatively.  Wrist brace may be helpful and recommend voltaren gel.

## 2018-11-11 NOTE — Patient Instructions (Signed)
We'll be in touch with xray results.

## 2018-11-12 ENCOUNTER — Telehealth: Payer: Self-pay | Admitting: Family Medicine

## 2018-11-12 ENCOUNTER — Encounter: Payer: Self-pay | Admitting: Family Medicine

## 2018-11-12 ENCOUNTER — Other Ambulatory Visit: Payer: Self-pay | Admitting: Family Medicine

## 2018-11-12 MED ORDER — DICLOFENAC SODIUM 1 % TD GEL: 2.0000 g | Freq: Four times a day (QID) | TRANSDERMAL | 1 refills | Status: DC

## 2018-11-12 NOTE — Telephone Encounter (Signed)
Patient called checking on the status of the Xrays done on 11-11-2018. Please advise

## 2018-11-19 ENCOUNTER — Telehealth: Payer: Self-pay

## 2018-11-19 NOTE — Telephone Encounter (Signed)
Covid-19 screening questions   Do you now or have you had a fever in the last 14 days? NO   Do you have any respiratory symptoms of shortness of breath or cough now or in the last 14 days? NO  Do you have any family members or close contacts with diagnosed or suspected Covid-19 in the past 14 days? NO  Have you been tested for Covid-19 and found to be positive? NO        

## 2018-11-20 ENCOUNTER — Other Ambulatory Visit: Payer: Self-pay | Admitting: Gastroenterology

## 2018-11-20 ENCOUNTER — Other Ambulatory Visit: Payer: Self-pay

## 2018-11-20 ENCOUNTER — Encounter: Payer: Self-pay | Admitting: Gastroenterology

## 2018-11-20 ENCOUNTER — Ambulatory Visit (AMBULATORY_SURGERY_CENTER): Admitting: Gastroenterology

## 2018-11-20 VITALS — BP 114/83 | HR 66 | Temp 98.7°F | Resp 22 | Ht 69.5 in | Wt 224.8 lb

## 2018-11-20 DIAGNOSIS — Z1211 Encounter for screening for malignant neoplasm of colon: Secondary | ICD-10-CM | POA: Diagnosis not present

## 2018-11-20 DIAGNOSIS — K635 Polyp of colon: Secondary | ICD-10-CM

## 2018-11-20 DIAGNOSIS — D122 Benign neoplasm of ascending colon: Secondary | ICD-10-CM

## 2018-11-20 DIAGNOSIS — D123 Benign neoplasm of transverse colon: Secondary | ICD-10-CM

## 2018-11-20 DIAGNOSIS — K5730 Diverticulosis of large intestine without perforation or abscess without bleeding: Secondary | ICD-10-CM

## 2018-11-20 DIAGNOSIS — D120 Benign neoplasm of cecum: Secondary | ICD-10-CM

## 2018-11-20 MED ORDER — SODIUM CHLORIDE 0.9 % IV SOLN: 500.0000 mL | Freq: Once | INTRAVENOUS | Status: DC

## 2018-11-20 NOTE — Op Note (Signed)
Reginald Salazar Patient Name: Reginald Salazar Procedure Date: 11/20/2018 9:34 AM MRN: [id number] Endoscopist: Gerrit Heck , MD Age: 48 Referring MD:  Date of Birth: June 05, 1970 Gender: Male Account #: [id number] Procedure:                Colonoscopy Indications:              Screening for colorectal malignant neoplasm, This                            is the patient's first colonoscopy Medicines:                Monitored Anesthesia Care Procedure:                Pre-Anesthesia Assessment:                           - Prior to the procedure, a History and Physical                            was performed, and patient medications and                            allergies were reviewed. The patient's tolerance of                            previous anesthesia was also reviewed. The risks                            and benefits of the procedure and the sedation                            options and risks were discussed with the patient.                            All questions were answered, and informed consent                            was obtained. Prior Anticoagulants: The patient has                            taken no previous anticoagulant or antiplatelet                            agents. ASA Grade Assessment: II - A patient with                            mild systemic disease. After reviewing the risks                            and benefits, the patient was deemed in                            satisfactory condition to undergo the procedure.  After obtaining informed consent, the colonoscope                            was passed under direct vision. Throughout the                            procedure, the patient's blood pressure, pulse, and                            oxygen saturations were monitored continuously. The                            Colonoscope was introduced through the anus and                            advanced to the the  cecum, identified by                            appendiceal orifice and ileocecal valve. The                            colonoscopy was performed without difficulty. The                            patient tolerated the procedure well. The quality                            of the bowel preparation was adequate. The                            ileocecal valve, appendiceal orifice, and rectum                            were photographed. Scope In: 9:37:50 AM Scope Out: 9:56:07 AM Scope Withdrawal Time: 0 hours 15 minutes 59 seconds  Total Procedure Duration: 0 hours 18 minutes 17 seconds  Findings:                 The perianal and digital rectal examinations were                            normal.                           Three sessile polyps were found in the transverse                            colon, ascending colon and cecum. The polyps were 3                            to 4 mm in size. These polyps were removed with a                            cold snare. Resection and retrieval were complete.  Estimated blood loss was minimal.                           Multiple small and large-mouthed diverticula were                            found in the sigmoid colon, transverse colon,                            ascending colon and cecum.                           The retroflexed view of the distal rectum and anal                            verge was normal and showed no anal or rectal                            abnormalities. Complications:            No immediate complications. Estimated Blood Loss:     Estimated blood loss was minimal. Impression:               - Three 3 to 4 mm polyps in the transverse colon,                            in the ascending colon and in the cecum, removed                            with a cold snare. Resected and retrieved.                           - Diverticulosis in the sigmoid colon, in the                            transverse  colon, in the ascending colon and in the                            cecum.                           - The distal rectum and anal verge are normal on                            retroflexion view. Recommendation:           - Patient has a contact number available for                            emergencies. The signs and symptoms of potential                            delayed complications were discussed with the  patient. Return to normal activities tomorrow.                            Written discharge instructions were provided to the                            patient.                           - Resume previous diet.                           - Continue present medications.                           - Await pathology results.                           - Repeat colonoscopy for surveillance based on                            pathology results.                           - Return to GI clinic PRN. Gerrit Heck, MD 11/20/2018 9:59:58 AM

## 2018-11-20 NOTE — Patient Instructions (Signed)
YOU HAD AN ENDOSCOPIC PROCEDURE TODAY AT THE [facility]:   Refer to the procedure report that was given to you for any specific questions about what was found during the examination.  If the procedure report does not answer your questions, please call your gastroenterologist to clarify.  If you requested that your care partner not be given the details of your procedure findings, then the procedure report has been included in a sealed envelope for you to review at your convenience later.  YOU SHOULD EXPECT: Some feelings of bloating in the abdomen. Passage of more gas than usual.  Walking can help get rid of the air that was put into your GI tract during the procedure and reduce the bloating. If you had a lower endoscopy (such as a colonoscopy or flexible sigmoidoscopy) you may notice spotting of blood in your stool or on the toilet paper. If you underwent a bowel prep for your procedure, you may not have a normal bowel movement for a few days.  Please Note:  You might notice some irritation and congestion in your nose or some drainage.  This is from the oxygen used during your procedure.  There is no need for concern and it should clear up in a day or so.  SYMPTOMS TO REPORT IMMEDIATELY:   Following lower endoscopy (colonoscopy or flexible sigmoidoscopy):  Excessive amounts of blood in the stool  Significant tenderness or worsening of abdominal pains  Swelling of the abdomen that is new, acute  Fever of 100F or higher  For urgent or emergent issues, a gastroenterologist can be reached at any hour by calling [phone].   DIET:  We do recommend a small meal at first, but then you may proceed to your regular diet.  Drink plenty of fluids but you should avoid alcoholic beverages for 24 hours.  ACTIVITY:  You should plan to take it easy for the rest of today and you should NOT DRIVE or use heavy machinery until tomorrow (because of the sedation medicines used during the test).     FOLLOW UP: Our staff will call the number listed on your records 48-72 hours following your procedure to check on you and address any questions or concerns that you may have regarding the information given to you following your procedure. If we do not reach you, we will leave a message.  We will attempt to reach you two times.  During this call, we will ask if you have developed any symptoms of COVID 19. If you develop any symptoms (ie: fever, flu-like symptoms, shortness of breath, cough etc.) before then, please call [phone].  If you test positive for Covid 19 in the 2 weeks post procedure, please call and report this information to us.    If any biopsies were taken you will be contacted by phone or by letter within the next 1-3 weeks.  Please call us at [phone] if you have not heard about the biopsies in 3 weeks.    SIGNATURES/CONFIDENTIALITY: You and/or your care partner have signed paperwork which will be entered into your electronic medical record.  These signatures attest to the fact that that the information above on your After Visit Summary has been reviewed and is understood.  Full responsibility of the confidentiality of this discharge information lies with you and/or your care-partner. 

## 2018-11-20 NOTE — Progress Notes (Signed)
Called to room to assist during endoscopic procedure.  Patient ID and intended procedure confirmed with present staff. Received instructions for my participation in the procedure from the performing physician.  

## 2018-11-20 NOTE — Progress Notes (Signed)
Report given to PACU, vss 

## 2018-11-20 NOTE — Progress Notes (Signed)
Pt's states no medical or surgical changes since previsit or office visit. 

## 2018-11-20 NOTE — Progress Notes (Signed)
KA- Temp CW- Vitals 

## 2018-11-22 ENCOUNTER — Telehealth: Payer: Self-pay

## 2018-11-22 NOTE — Telephone Encounter (Signed)
Attempted to reach pt. With follow-up call following endoscopic procedure 11/20/2018.  LM on pt. Voice mail.  Will try to reach pt. Again later today.

## 2018-11-22 NOTE — Telephone Encounter (Signed)
  Follow up Call-  Call back number 11/20/2018  Post procedure Call Back phone  # [phone]  Permission to leave phone message Yes  Some recent data might be hidden     Patient questions:  Do you have a fever, pain , or abdominal swelling? No. Pain Score  0 *  Have you tolerated food without any problems? Yes.    Have you been able to return to your normal activities? Yes.    Do you have any questions about your discharge instructions: Diet   No. Medications  No. Follow up visit  No.  Do you have questions or concerns about your Care? No.  Actions: * If pain score is 4 or above: No action needed, pain less then 4 1. Have you developed a fever since your procedure? no  2.   Have you had an respiratory symptoms (SOB or cough) since your procedure? no  3.   Have you tested positive for COVID 19 since your procedure no  4.   Have you had any family members/close contacts diagnosed with the COVID 19 since your procedure?  no   If yes to any of these questions please route to Joylene John, RN and Alphonsa Gin, Therapist, sports.

## 2018-11-26 ENCOUNTER — Encounter: Payer: Self-pay | Admitting: Gastroenterology

## 2018-12-12 ENCOUNTER — Encounter: Payer: Self-pay | Admitting: Family Medicine

## 2018-12-13 ENCOUNTER — Other Ambulatory Visit: Payer: Self-pay | Admitting: Family Medicine

## 2018-12-13 DIAGNOSIS — M545 Low back pain, unspecified: Secondary | ICD-10-CM

## 2018-12-13 MED ORDER — OXYCODONE-ACETAMINOPHEN 5-325 MG PO TABS: 1.0000 | ORAL_TABLET | Freq: Four times a day (QID) | ORAL | 0 refills | Status: DC | PRN

## 2018-12-13 MED ORDER — ALBUTEROL SULFATE HFA 108 (90 BASE) MCG/ACT IN AERS: INHALATION_SPRAY | RESPIRATORY_TRACT | 3 refills | Status: DC

## 2019-01-22 ENCOUNTER — Other Ambulatory Visit: Payer: Self-pay | Admitting: Family Medicine

## 2019-01-22 ENCOUNTER — Encounter: Payer: Self-pay | Admitting: Family Medicine

## 2019-01-22 ENCOUNTER — Telehealth: Payer: Self-pay

## 2019-01-22 MED ORDER — OXYCODONE-ACETAMINOPHEN 5-325 MG PO TABS: 1.0000 | ORAL_TABLET | Freq: Four times a day (QID) | ORAL | 0 refills | Status: DC | PRN

## 2019-01-22 NOTE — Telephone Encounter (Signed)
Questions for Screening COVID-19   Travel or Contacts: no  During this illness, did/does the patient experience any of the following symptoms? Fever >100.67F []   Yes [x]   No []   Unknown Subjective fever (felt feverish) []   Yes [x]   No []   Unknown Chills []   Yes [x]   No []   Unknown Muscle aches (myalgia) []   Yes [x]   No []   Unknown Runny nose (rhinorrhea) []   Yes [x]   No []   Unknown Sore throat []   Yes [x]   No []   Unknown Cough (new onset or worsening of chronic cough) []   Yes [x]   No []   Unknown Shortness of breath (dyspnea) []   Yes [x]   No []   Unknown Nausea or vomiting []   Yes [x]   No []   Unknown Headache []   Yes [x]   No []   Unknown Abdominal pain  []   Yes [x]   No []   Unknown Diarrhea (?3 loose/looser than normal stools/24hr period) []   Yes [x]   No []   Unknown

## 2019-01-23 ENCOUNTER — Ambulatory Visit (INDEPENDENT_AMBULATORY_CARE_PROVIDER_SITE_OTHER)

## 2019-01-23 ENCOUNTER — Other Ambulatory Visit: Payer: Self-pay

## 2019-01-23 ENCOUNTER — Other Ambulatory Visit

## 2019-01-23 DIAGNOSIS — M545 Low back pain: Secondary | ICD-10-CM | POA: Diagnosis not present

## 2019-02-19 ENCOUNTER — Ambulatory Visit (INDEPENDENT_AMBULATORY_CARE_PROVIDER_SITE_OTHER): Admitting: Family Medicine

## 2019-02-19 ENCOUNTER — Encounter: Payer: Self-pay | Admitting: Family Medicine

## 2019-02-19 ENCOUNTER — Ambulatory Visit (INDEPENDENT_AMBULATORY_CARE_PROVIDER_SITE_OTHER)

## 2019-02-19 ENCOUNTER — Other Ambulatory Visit: Payer: Self-pay

## 2019-02-19 VITALS — BP 126/64 | HR 86 | Temp 98.7°F | Ht 69.0 in | Wt 220.6 lb

## 2019-02-19 DIAGNOSIS — R202 Paresthesia of skin: Secondary | ICD-10-CM | POA: Diagnosis not present

## 2019-02-19 DIAGNOSIS — E039 Hypothyroidism, unspecified: Secondary | ICD-10-CM

## 2019-02-19 DIAGNOSIS — M542 Cervicalgia: Secondary | ICD-10-CM | POA: Diagnosis not present

## 2019-02-19 MED ORDER — TIZANIDINE HCL 4 MG PO TABS: 4.0000 mg | ORAL_TABLET | Freq: Every day | ORAL | 0 refills | Status: DC

## 2019-02-19 NOTE — Assessment & Plan Note (Signed)
Update TSH

## 2019-02-19 NOTE — Patient Instructions (Signed)
Try muscle relaxer at bedtime.  Alternate heat/ice to jaw/temple area Let me know how things are going in a few weeks

## 2019-02-19 NOTE — Assessment & Plan Note (Signed)
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  Also may have some cervical pathology but doesn't really follow distribution of greater occipital nerve.  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  He will let me know how things are going in a couple of weeks.

## 2019-02-19 NOTE — Progress Notes (Signed)
Reginald Salazar - 49 y.o. male MRN [id number]  Date of birth: 20-Feb-1970  Subjective Chief Complaint  Patient presents with  . Pain    c/o right side of head and ear skin feels like it's crawling right side of jaw and right ear pain sometimes.    HPI Reginald Salazar is a 49 y.o. male with history of HTN, hypothyroidism, OSA and nicotine dependence here today with complaint of abnormal skin sensation along R ear/scalp.  He reports that this started a couple of weeks ago.  Describes as sensation of something crawling over his R ear and scalp with some numbness/tingling at times.  He has had issues with jaw and was told by his dentist that he clenches on the R side.  He also has some neck pain and radiation into his arms. He denies weakness, ear pain, drainage or rash.  He uses cpap and wears glasses and hasn't noticed that these make symptoms worse.    ROS:  A comprehensive ROS was completed and negative except as noted per HPI  . No Known Allergies  Past Medical History:  Diagnosis Date  . Hernia, abdominal 2008  . Hyperlipidemia   . Hypertension   . Hypothyroidism   . Sleep apnea    CPAP  . Smoker   . Thyroid disease     Past Surgical History:  Procedure Laterality Date  . BACK SURGERY  2017    Social History   Socioeconomic History  . Marital status: Widowed    Spouse name: Not on file  . Number of children: Not on file  . Years of education: Not on file  . Highest education level: Not on file  Occupational History  . Not on file  Tobacco Use  . Smoking status: Current Every Day Smoker    Packs/day: 0.75    Years: 30.00    Pack years: 22.50    Types: Cigarettes  . Smokeless tobacco: Never Used  Substance and Sexual Activity  . Alcohol use: Yes    Alcohol/week: 12.0 standard drinks    Types: 12 Cans of beer per week  . Drug use: Not Currently  . Sexual activity: Not on file  Other Topics Concern  . Not on file  Social History Narrative  . Not on file    Social Determinants of Health   Financial Resource Strain:   . Difficulty of Paying Living Expenses: Not on file  Food Insecurity:   . Worried About Charity fundraiser in the Last Year: Not on file  . Ran Out of Food in the Last Year: Not on file  Transportation Needs:   . Lack of Transportation (Medical): Not on file  . Lack of Transportation (Non-Medical): Not on file  Physical Activity:   . Days of Exercise per Week: Not on file  . Minutes of Exercise per Session: Not on file  Stress:   . Feeling of Stress : Not on file  Social Connections:   . Frequency of Communication with Friends and Family: Not on file  . Frequency of Social Gatherings with Friends and Family: Not on file  . Attends Religious Services: Not on file  . Active Member of Clubs or Organizations: Not on file  . Attends Archivist Meetings: Not on file  . Marital Status: Not on file    Family History  Problem Relation Age of Onset  . Rheum arthritis Mother   . Mitral valve prolapse Mother   . Stomach cancer Maternal Grandmother   .  Colon cancer Neg Hx   . Colon polyps Neg Hx   . Esophageal cancer Neg Hx   . Rectal cancer Neg Hx     Health Maintenance  Topic Date Due  . HIV Screening  08/02/1983  . INFLUENZA VACCINE  05/07/2019 (Originally 09/07/2018)  . COLONOSCOPY  11/19/2021  . TETANUS/TDAP  03/21/2027    ----------------------------------------------------------------------------------------------------------------------------------------------------------------------------------------------------------------- Physical Exam BP 126/64   Pulse 86   Temp 98.7 F (37.1 C) (Tympanic)   Ht 5\' 9"  (1.753 m)   Wt 220 lb 9.6 oz (100.1 kg)   SpO2 98%   BMI 32.58 kg/m   Physical Exam Constitutional:      Appearance: Normal appearance.  HENT:     Head: Normocephalic and atraumatic.     Right Ear: Tympanic membrane, ear canal and external ear normal.     Left Ear: Tympanic membrane, ear  canal and external ear normal.     Mouth/Throat:     Mouth: Mucous membranes are moist.     Comments: Mild ttp along R TMJ Eyes:     General: No scleral icterus. Cardiovascular:     Rate and Rhythm: Normal rate and regular rhythm.  Pulmonary:     Effort: Pulmonary effort is normal.     Breath sounds: Normal breath sounds.  Musculoskeletal:     Cervical back: Neck supple.     Comments: Upper trapezius is tight with ttp.  ROM of neck is normal.   Neurological:     General: No focal deficit present.     Mental Status: He is alert.  Psychiatric:        Mood and Affect: Mood normal.        Behavior: Behavior normal.     ------------------------------------------------------------------------------------------------------------------------------------------------------------------------------------------------------------------- Assessment and Plan  Facial paresthesia 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  Also may have some cervical pathology but doesn't really follow distribution of greater occipital nerve.  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  He will let me know how things are going in a couple of weeks.    Hypothyroidism Update TSH    This visit occurred during the SARS-CoV-2 public health emergency.  Safety protocols were in place, including screening questions prior to the visit, additional usage of staff PPE, and extensive cleaning of exam room while observing appropriate contact time as indicated for disinfecting solutions.

## 2019-02-20 LAB — TSH: TSH: 1.45 u[IU]/mL (ref 0.35–4.50)

## 2019-02-27 ENCOUNTER — Other Ambulatory Visit: Payer: Self-pay | Admitting: Family Medicine

## 2019-02-27 ENCOUNTER — Encounter: Payer: Self-pay | Admitting: Family Medicine

## 2019-02-27 MED ORDER — OXYCODONE-ACETAMINOPHEN 5-325 MG PO TABS: 1.0000 | ORAL_TABLET | Freq: Four times a day (QID) | ORAL | 0 refills | Status: DC | PRN

## 2019-03-31 ENCOUNTER — Encounter: Payer: Self-pay | Admitting: Family Medicine

## 2019-04-01 ENCOUNTER — Other Ambulatory Visit: Payer: Self-pay | Admitting: Family Medicine

## 2019-04-01 MED ORDER — OXYCODONE-ACETAMINOPHEN 5-325 MG PO TABS: 1.0000 | ORAL_TABLET | Freq: Four times a day (QID) | ORAL | 0 refills | Status: DC | PRN

## 2019-05-01 ENCOUNTER — Encounter: Payer: Self-pay | Admitting: Family Medicine

## 2019-05-01 NOTE — Telephone Encounter (Signed)
Routing to provider  

## 2019-05-02 ENCOUNTER — Other Ambulatory Visit: Payer: Self-pay | Admitting: Family Medicine

## 2019-05-02 MED ORDER — OXYCODONE-ACETAMINOPHEN 5-325 MG PO TABS: 1.0000 | ORAL_TABLET | Freq: Four times a day (QID) | ORAL | 0 refills | Status: DC | PRN

## 2019-06-03 ENCOUNTER — Encounter: Payer: Self-pay | Admitting: Family Medicine

## 2019-06-09 ENCOUNTER — Encounter: Payer: Self-pay | Admitting: Family Medicine

## 2019-06-09 ENCOUNTER — Other Ambulatory Visit: Payer: Self-pay

## 2019-06-09 ENCOUNTER — Ambulatory Visit (INDEPENDENT_AMBULATORY_CARE_PROVIDER_SITE_OTHER): Admitting: Family Medicine

## 2019-06-09 VITALS — BP 134/85 | HR 75 | Ht 68.9 in | Wt 215.5 lb

## 2019-06-09 DIAGNOSIS — E782 Mixed hyperlipidemia: Secondary | ICD-10-CM

## 2019-06-09 DIAGNOSIS — E038 Other specified hypothyroidism: Secondary | ICD-10-CM

## 2019-06-09 DIAGNOSIS — E063 Autoimmune thyroiditis: Secondary | ICD-10-CM | POA: Diagnosis not present

## 2019-06-09 DIAGNOSIS — M47816 Spondylosis without myelopathy or radiculopathy, lumbar region: Secondary | ICD-10-CM

## 2019-06-09 DIAGNOSIS — I10 Essential (primary) hypertension: Secondary | ICD-10-CM | POA: Diagnosis not present

## 2019-06-09 MED ORDER — SYNTHROID 175 MCG PO TABS: ORAL_TABLET | ORAL | 2 refills | Status: DC

## 2019-06-09 MED ORDER — OXYCODONE-ACETAMINOPHEN 5-325 MG PO TABS: 1.0000 | ORAL_TABLET | Freq: Four times a day (QID) | ORAL | 0 refills | Status: DC | PRN

## 2019-06-09 MED ORDER — ATORVASTATIN CALCIUM 20 MG PO TABS: 20.0000 mg | ORAL_TABLET | Freq: Every day | ORAL | 3 refills | Status: DC

## 2019-06-09 MED ORDER — LISINOPRIL 10 MG PO TABS: 10.0000 mg | ORAL_TABLET | Freq: Every day | ORAL | 3 refills | Status: DC

## 2019-06-09 NOTE — Assessment & Plan Note (Signed)
S/p laminectomy with post laminectomy pain.  PDMP reviewed.  Percocet renewed.

## 2019-06-09 NOTE — Progress Notes (Signed)
Reginald Salazar - 48 y.o. male MRN [id number]  Date of birth: 1970/06/10  Subjective Chief Complaint  Patient presents with  . Hypertension    HPI Reginald Salazar is a 49 y.o. male with history of HTN, Hypothyroidism, HLD and chronic low back pain with sciatica.  -HTN: Managed with lisinopril 10mg .  He is doing well with this.  He denies side effects.  He has not had any symptoms related to htn including chest pain, shortness of breath, palpitations, headache or vision changes.   -HLD:  Managed with lipitor 20mg  every other day.  He was taking daily however had increased muscle aches.    -Hypothyroid:  Compliant with synthroid.  Denies symptoms with hypo/hyper-thyroidism  -Low back pain:  History of chronic low back  Pain.  Has had post laminectomy pain.  This was managed by his neurosurgeon up until retirement last year.  Currently managing with percocet 5/325mg .  He continues to do well with this and denies side effects.    ROS:  A comprehensive ROS was completed and negative except as noted per HPI  No Known Allergies  Past Medical History:  Diagnosis Date  . Hernia, abdominal 2008  . Hyperlipidemia   . Hypertension   . Hypothyroidism   . Sleep apnea    CPAP  . Smoker   . Thyroid disease     Past Surgical History:  Procedure Laterality Date  . BACK SURGERY  2017    Social History   Socioeconomic History  . Marital status: Married    Spouse name: Not on file  . Number of children: Not on file  . Years of education: Not on file  . Highest education level: Not asked  Occupational History  . Occupation: Retired  Tobacco Use  . Smoking status: Current Every Day Smoker    Packs/day: 0.75    Years: 30.00    Pack years: 22.50    Types: Cigarettes  . Smokeless tobacco: Never Used  Substance and Sexual Activity  . Alcohol use: Yes    Alcohol/week: 12.0 standard drinks    Types: 12 Cans of beer per week  . Drug use: Not Currently  . Sexual activity: Yes     Partners: Female  Other Topics Concern  . Not on file  Social History Narrative  . Not on file   Social Determinants of Health   Financial Resource Strain:   . Difficulty of Paying Living Expenses:   Food Insecurity:   . Worried About Charity fundraiser in the Last Year:   . Arboriculturist in the Last Year:   Transportation Needs:   . Film/video editor (Medical):   Marland Kitchen Lack of Transportation (Non-Medical):   Physical Activity:   . Days of Exercise per Week:   . Minutes of Exercise per Session:   Stress:   . Feeling of Stress :   Social Connections:   . Frequency of Communication with Friends and Family:   . Frequency of Social Gatherings with Friends and Family:   . Attends Religious Services:   . Active Member of Clubs or Organizations:   . Attends Archivist Meetings:   Marland Kitchen Marital Status:     Family History  Problem Relation Age of Onset  . Rheum arthritis Mother   . Mitral valve prolapse Mother   . Stomach cancer Maternal Grandmother   . Colon cancer Neg Hx   . Colon polyps Neg Hx   . Esophageal cancer Neg Hx   .  Rectal cancer Neg Hx     Health Maintenance  Topic Date Due  . HIV Screening  Never done  . INFLUENZA VACCINE  09/07/2019  . COLONOSCOPY  11/19/2021  . TETANUS/TDAP  03/21/2027  . COVID-19 Vaccine  Completed     ----------------------------------------------------------------------------------------------------------------------------------------------------------------------------------------------------------------- Physical Exam BP 134/85 (BP Location: Left Arm, Patient Position: Sitting, Cuff Size: Large)   Pulse 75   Ht 5' 8.9" (1.75 m)   Wt 215 lb 8 oz (97.8 kg)   SpO2 99%   BMI 31.92 kg/m   Physical Exam Constitutional:      Appearance: Normal appearance.  HENT:     Head: Normocephalic and atraumatic.  Eyes:     General: No scleral icterus. Cardiovascular:     Rate and Rhythm: Normal rate and regular rhythm.   Pulmonary:     Effort: Pulmonary effort is normal.     Breath sounds: Normal breath sounds.  Musculoskeletal:     Cervical back: Neck supple.  Skin:    General: Skin is warm and dry.  Neurological:     Mental Status: He is alert.  Psychiatric:        Mood and Affect: Mood normal.        Behavior: Behavior normal.     ------------------------------------------------------------------------------------------------------------------------------------------------------------------------------------------------------------------- Assessment and Plan  Hypothyroidism Stable symptoms Update TSH  Mixed hyperlipidemia Taking atorvastatin qod.  Update lipid panel.   Lumbar spondylosis S/p laminectomy with post laminectomy pain.  PDMP reviewed.  Percocet renewed.   Hypertension Blood pressure is at goal at for age and co-morbidities.  I recommend he continue lisinopril.  In addition they were instructed to follow a low sodium diet with regular exercise to help to maintain adequate control of blood pressure.     Meds ordered this encounter  Medications  . atorvastatin (LIPITOR) 20 MG tablet    Sig: Take 1 tablet (20 mg total) by mouth daily.    Dispense:  90 tablet    Refill:  3  . lisinopril (ZESTRIL) 10 MG tablet    Sig: Take 1 tablet (10 mg total) by mouth daily.    Dispense:  90 tablet    Refill:  3  . SYNTHROID 175 MCG tablet    Sig: Take #1/2 tablet on Saturday and #1 tablet Sunday-Friday.    Dispense:  90 tablet    Refill:  2    Please dispense brand name of Synthroid . Levothyroxine causes hair loss.  Marland Kitchen oxyCODONE-acetaminophen (PERCOCET/ROXICET) 5-325 MG tablet    Sig: Take 1 tablet by mouth every 6 (six) hours as needed for severe pain.    Dispense:  120 tablet    Refill:  0    Return in about 6 months (around 12/10/2019) for Hypothyroid/Pain med.    This visit occurred during the SARS-CoV-2 public health emergency.  Safety protocols were in place, including  screening questions prior to the visit, additional usage of staff PPE, and extensive cleaning of exam room while observing appropriate contact time as indicated for disinfecting solutions.

## 2019-06-09 NOTE — Assessment & Plan Note (Signed)
Blood pressure is at goal at for age and co-morbidities.  I recommend he continue lisinopril.  In addition they were instructed to follow a low sodium diet with regular exercise to help to maintain adequate control of blood pressure.

## 2019-06-09 NOTE — Assessment & Plan Note (Signed)
Taking atorvastatin qod.  Update lipid panel.

## 2019-06-09 NOTE — Patient Instructions (Addendum)
Great to see you! Continue current medications.  See me again in 6 months.  

## 2019-06-09 NOTE — Assessment & Plan Note (Signed)
Stable symptoms Update TSH

## 2019-06-12 LAB — CBC
HCT: 48.2 % (ref 38.5–50.0)
Hemoglobin: 16.4 g/dL (ref 13.2–17.1)
MCH: 31.1 pg (ref 27.0–33.0)
MCHC: 34 g/dL (ref 32.0–36.0)
MCV: 91.3 fL (ref 80.0–100.0)
MPV: 9.6 fL (ref 7.5–12.5)
Platelets: 256 10*3/uL (ref 140–400)
RBC: 5.28 10*6/uL (ref 4.20–5.80)
RDW: 12.3 % (ref 11.0–15.0)
WBC: 5.2 10*3/uL (ref 3.8–10.8)

## 2019-06-12 LAB — COMPLETE METABOLIC PANEL WITH GFR
AG Ratio: 1.8 (calc) (ref 1.0–2.5)
ALT: 29 U/L (ref 9–46)
AST: 22 U/L (ref 10–35)
Albumin: 4.6 g/dL (ref 3.6–5.1)
Alkaline phosphatase (APISO): 51 U/L (ref 35–144)
BUN: 9 mg/dL (ref 7–25)
CO2: 30 mmol/L (ref 20–32)
Calcium: 9.6 mg/dL (ref 8.6–10.3)
Chloride: 101 mmol/L (ref 98–110)
Creat: 0.87 mg/dL (ref 0.70–1.33)
GFR, Est African American: 117 mL/min/{1.73_m2} (ref >=60)
GFR, Est Non African American: 101 mL/min/{1.73_m2} (ref >=60)
Globulin: 2.6 g/dL (calc) (ref 1.9–3.7)
Glucose, Bld: 96 mg/dL (ref 65–99)
Potassium: 4.8 mmol/L (ref 3.5–5.3)
Sodium: 138 mmol/L (ref 135–146)
Total Bilirubin: 0.7 mg/dL (ref 0.2–1.2)
Total Protein: 7.2 g/dL (ref 6.1–8.1)

## 2019-06-12 LAB — LIPID PANEL
Cholesterol: 237 mg/dL — ABNORMAL HIGH (ref <200)
HDL: 57 mg/dL (ref >=40)
LDL Cholesterol (Calc): 141 mg/dL (calc) — ABNORMAL HIGH
Non-HDL Cholesterol (Calc): 180 mg/dL (calc) — ABNORMAL HIGH (ref <130)
Total CHOL/HDL Ratio: 4.2 (calc) (ref <5.0)
Triglycerides: 251 mg/dL — ABNORMAL HIGH (ref <150)

## 2019-06-12 LAB — TSH: TSH: 2.25 mIU/L (ref 0.40–4.50)

## 2019-07-10 ENCOUNTER — Encounter: Payer: Self-pay | Admitting: Family Medicine

## 2019-07-11 MED ORDER — OXYCODONE-ACETAMINOPHEN 5-325 MG PO TABS: 1.0000 | ORAL_TABLET | Freq: Four times a day (QID) | ORAL | 0 refills | Status: DC | PRN

## 2019-08-13 ENCOUNTER — Encounter: Payer: Self-pay | Admitting: Family Medicine

## 2019-08-13 MED ORDER — OXYCODONE-ACETAMINOPHEN 5-325 MG PO TABS: 1.0000 | ORAL_TABLET | Freq: Four times a day (QID) | ORAL | 0 refills | Status: DC | PRN

## 2019-08-13 NOTE — Telephone Encounter (Signed)
Last written 07-11-19  Last OV states "Return in about 6 months (around 12/10/2019) for Hypothyroid/Pain med."  RX pended

## 2019-09-03 ENCOUNTER — Other Ambulatory Visit: Payer: Self-pay | Admitting: Family Medicine

## 2019-09-16 ENCOUNTER — Encounter: Payer: Self-pay | Admitting: Family Medicine

## 2019-09-16 MED ORDER — OXYCODONE-ACETAMINOPHEN 5-325 MG PO TABS: 1.0000 | ORAL_TABLET | Freq: Four times a day (QID) | ORAL | 0 refills | Status: DC | PRN

## 2019-09-16 NOTE — Telephone Encounter (Signed)
Routing to covering provider.  °

## 2019-10-20 ENCOUNTER — Other Ambulatory Visit: Payer: Self-pay

## 2019-10-20 ENCOUNTER — Encounter: Payer: Self-pay | Admitting: Family Medicine

## 2019-10-21 ENCOUNTER — Other Ambulatory Visit: Payer: Self-pay | Admitting: Family Medicine

## 2019-10-21 MED ORDER — OXYCODONE-ACETAMINOPHEN 5-325 MG PO TABS: 1.0000 | ORAL_TABLET | Freq: Four times a day (QID) | ORAL | 0 refills | Status: DC | PRN

## 2019-10-23 ENCOUNTER — Ambulatory Visit: Admitting: Family Medicine

## 2019-10-24 ENCOUNTER — Encounter: Payer: Self-pay | Admitting: Family Medicine

## 2019-10-28 ENCOUNTER — Other Ambulatory Visit: Payer: Self-pay | Admitting: Oncology

## 2019-10-28 ENCOUNTER — Ambulatory Visit: Admitting: Family Medicine

## 2019-10-28 ENCOUNTER — Encounter: Payer: Self-pay | Admitting: Oncology

## 2019-10-28 DIAGNOSIS — U071 COVID-19: Secondary | ICD-10-CM

## 2019-10-28 NOTE — Progress Notes (Signed)
I connected by phone with  Reginald Salazar to discuss the potential use of an new treatment for mild to moderate COVID-19 viral infection in non-hospitalized patients.   This patient is a age/sex that meets the FDA criteria for Emergency Use Authorization of casirivimab\imdevimab.  Has a (+) direct SARS-CoV-2 viral test result 1. Has mild or moderate COVID-19  2. Is ? 51 years of age and weighs ? 40 kg 3. Is NOT hospitalized due to COVID-19 4. Is NOT requiring oxygen therapy or requiring an increase in baseline oxygen flow rate due to COVID-19 5. Is within 10 days of symptom onset 6. Has at least one of the high risk factor(s) for progression to severe COVID-19 and/or hospitalization as defined in EUA. Specific high risk criteria : Past Medical History:  Diagnosis Date  . Hernia, abdominal 2008  . Hyperlipidemia   . Hypertension   . Hypothyroidism   . Sleep apnea    CPAP  . Smoker   . Thyroid disease   ?  ?    Symptom onset  10/25/19   I have spoken and communicated the following to the patient or parent/caregiver:   1. FDA has authorized the emergency use of casirivimab\imdevimab for the treatment of mild to moderate COVID-19 in adults and pediatric patients with positive results of direct SARS-CoV-2 viral testing who are 20 years of age and older weighing at least 40 kg, and who are at high risk for progressing to severe COVID-19 and/or hospitalization.   2. The significant known and potential risks and benefits of casirivimab\imdevimab, and the extent to which such potential risks and benefits are unknown.   3. Information on available alternative treatments and the risks and benefits of those alternatives, including clinical trials.   4. Patients treated with casirivimab\imdevimab should continue to self-isolate and use infection control measures (e.g., wear mask, isolate, social distance, avoid sharing personal items, clean and disinfect "high touch" surfaces, and frequent  handwashing) according to CDC guidelines.    5. The patient or parent/caregiver has the option to accept or refuse casirivimab\imdevimab .   After reviewing this information with the patient, The patient agreed to proceed with receiving casirivimab\imdevimab infusion and will be provided a copy of the Fact sheet prior to receiving the infusion.Rulon Abide, AGNP-C [phone] ([place])

## 2019-10-29 ENCOUNTER — Ambulatory Visit (HOSPITAL_COMMUNITY)
Admission: RE | Admit: 2019-10-29 | Discharge: 2019-10-29 | Disposition: A | Discharge status: Home / Self Care | Source: Ambulatory Visit | Attending: Pulmonary Disease | Admitting: Pulmonary Disease

## 2019-10-29 ENCOUNTER — Other Ambulatory Visit (HOSPITAL_COMMUNITY): Payer: Self-pay

## 2019-10-29 DIAGNOSIS — U071 COVID-19: Secondary | ICD-10-CM | POA: Insufficient documentation

## 2019-10-29 MED ORDER — ALBUTEROL SULFATE HFA 108 (90 BASE) MCG/ACT IN AERS: 2.0000 | INHALATION_SPRAY | Freq: Once | RESPIRATORY_TRACT | Status: DC | PRN

## 2019-10-29 MED ORDER — METHYLPREDNISOLONE SODIUM SUCC 125 MG IJ SOLR: 125.0000 mg | Freq: Once | INTRAMUSCULAR | Status: DC | PRN

## 2019-10-29 MED ORDER — SODIUM CHLORIDE 0.9 % IV SOLN
1200.0000 mg | Freq: Once | INTRAVENOUS | Status: AC
  Administered 2019-10-29: 1200 mg via INTRAVENOUS

## 2019-10-29 MED ORDER — FAMOTIDINE IN NACL 20-0.9 MG/50ML-% IV SOLN: 20.0000 mg | Freq: Once | INTRAVENOUS | Status: DC | PRN

## 2019-10-29 MED ORDER — SODIUM CHLORIDE 0.9 % IV SOLN: INTRAVENOUS | Status: DC | PRN

## 2019-10-29 MED ORDER — EPINEPHRINE 0.3 MG/0.3ML IJ SOAJ: 0.3000 mg | Freq: Once | INTRAMUSCULAR | Status: DC | PRN

## 2019-10-29 MED ORDER — DIPHENHYDRAMINE HCL 50 MG/ML IJ SOLN: 50.0000 mg | Freq: Once | INTRAMUSCULAR | Status: DC | PRN

## 2019-10-29 NOTE — Progress Notes (Signed)
  Diagnosis: COVID-19  Physician: Dr. Asencion Noble  Procedure: [facility] Med: casirivimab\imdevimab infusion - Provided patient with casirivimab\imdevimab fact sheet for patients, parents and caregivers prior to infusion.  Complications: No immediate complications noted.  Discharge: Discharged home   Gaye Alken 10/29/2019

## 2019-10-29 NOTE — Discharge Instructions (Signed)

## 2019-11-11 ENCOUNTER — Telehealth: Payer: Self-pay

## 2019-11-11 ENCOUNTER — Encounter: Payer: Self-pay | Admitting: Family Medicine

## 2019-11-11 ENCOUNTER — Ambulatory Visit (INDEPENDENT_AMBULATORY_CARE_PROVIDER_SITE_OTHER): Admitting: Family Medicine

## 2019-11-11 DIAGNOSIS — L989 Disorder of the skin and subcutaneous tissue, unspecified: Secondary | ICD-10-CM | POA: Insufficient documentation

## 2019-11-11 DIAGNOSIS — M47816 Spondylosis without myelopathy or radiculopathy, lumbar region: Secondary | ICD-10-CM

## 2019-11-11 DIAGNOSIS — I10 Essential (primary) hypertension: Secondary | ICD-10-CM

## 2019-11-11 MED ORDER — OXYCODONE-ACETAMINOPHEN 5-325 MG PO TABS
1.0000 | ORAL_TABLET | Freq: Four times a day (QID) | ORAL | 0 refills | Status: DC | PRN
Start: 2019-11-11 — End: 2020-07-27

## 2019-11-11 MED ORDER — LISINOPRIL 20 MG PO TABS
20.0000 mg | ORAL_TABLET | Freq: Every day | ORAL | 2 refills | Status: DC
Start: 2019-11-11 — End: 2020-10-06

## 2019-11-11 NOTE — Assessment & Plan Note (Signed)
Most of his skin lesions have appearance or SK, reassured.  He doesn't wish to pursue tx at this time.  Will continue to monitor for now.

## 2019-11-11 NOTE — Progress Notes (Signed)
Reginald Salazar - 49 y.o. male MRN [id number]  Date of birth: 1970-06-12  Subjective Chief Complaint  Patient presents with  . skin issues    HPI Reginald Salazar is a 49 y.o. male here today for with concern of skin lesions.  He is also following up for chronic pain.    He has a lesion on his scalp and back.  Areas are somewhat itchy.  Present for several weeks.  Denies pain or bleeding.  No prior history of skin cancer.    He continues on percocet as needed for chronic low back pain.  Has post-laminectomy pain syndrome.  Medication managed by neurosurgeon up until his retirement.  Medication continues to work well for him.  He is able to remain functional and complete household tasks.    ROS:  A comprehensive ROS was completed and negative except as noted per HPI  No Known Allergies  Past Medical History:  Diagnosis Date  . Hernia, abdominal 2008  . Hyperlipidemia   . Hypertension   . Hypothyroidism   . Sleep apnea    CPAP  . Smoker   . Thyroid disease     Past Surgical History:  Procedure Laterality Date  . BACK SURGERY  2017    Social History   Socioeconomic History  . Marital status: Married    Spouse name: Not on file  . Number of children: Not on file  . Years of education: Not on file  . Highest education level: Not asked  Occupational History  . Occupation: Retired  Tobacco Use  . Smoking status: Current Every Day Smoker    Packs/day: 0.75    Years: 30.00    Pack years: 22.50    Types: Cigarettes  . Smokeless tobacco: Never Used  Vaping Use  . Vaping Use: Never used  Substance and Sexual Activity  . Alcohol use: Yes    Alcohol/week: 12.0 standard drinks    Types: 12 Cans of beer per week  . Drug use: Not Currently  . Sexual activity: Yes    Partners: Female  Other Topics Concern  . Not on file  Social History Narrative  . Not on file   Social Determinants of Health   Financial Resource Strain:   . Difficulty of Paying Living Expenses:  Not on file  Food Insecurity:   . Worried About Charity fundraiser in the Last Year: Not on file  . Ran Out of Food in the Last Year: Not on file  Transportation Needs:   . Lack of Transportation (Medical): Not on file  . Lack of Transportation (Non-Medical): Not on file  Physical Activity:   . Days of Exercise per Week: Not on file  . Minutes of Exercise per Session: Not on file  Stress:   . Feeling of Stress : Not on file  Social Connections:   . Frequency of Communication with Friends and Family: Not on file  . Frequency of Social Gatherings with Friends and Family: Not on file  . Attends Religious Services: Not on file  . Active Member of Clubs or Organizations: Not on file  . Attends Archivist Meetings: Not on file  . Marital Status: Not on file    Family History  Problem Relation Age of Onset  . Rheum arthritis Mother   . Mitral valve prolapse Mother   . Stomach cancer Maternal Grandmother   . Colon cancer Neg Hx   . Colon polyps Neg Hx   . Esophageal cancer Neg  Hx   . Rectal cancer Neg Hx     Health Maintenance  Topic Date Due  . Hepatitis C Screening  Never done  . HIV Screening  Never done  . INFLUENZA VACCINE  Never done  . COLONOSCOPY  11/19/2021  . TETANUS/TDAP  03/21/2027  . COVID-19 Vaccine  Completed     ----------------------------------------------------------------------------------------------------------------------------------------------------------------------------------------------------------------- Physical Exam BP (!) 148/83 (BP Location: Right Arm, Patient Position: Sitting, Cuff Size: Normal)   Pulse (!) 103   Temp 98.4 F (36.9 C)   Wt 216 lb 9.6 oz (98.2 kg)   SpO2 96%   BMI 32.08 kg/m   Physical Exam Constitutional:      Appearance: Normal appearance.  Cardiovascular:     Rate and Rhythm: Normal rate and regular rhythm.  Pulmonary:     Effort: Pulmonary effort is normal.     Breath sounds: Normal breath sounds.   Skin:    Comments: Small pigmented lesion to mid back.  Raised with stuck on appearance.  Pigmentation even and symmetric.   Scaly area to r scalp.  No bleeding or ulceration.    Neurological:     General: No focal deficit present.     Mental Status: He is alert.  Psychiatric:        Mood and Affect: Mood normal.        Behavior: Behavior normal.     ------------------------------------------------------------------------------------------------------------------------------------------------------------------------------------------------------------------- Assessment and Plan  Lumbar spondylosis He continues to do well with current dosing of percocet.  Opioid risk score of 170. Updated pain contract signed today.  PDMP reviewed.  Continue opioid at current strength  Hypertension BP elevated today.  Increase lisinopril to 20mg  daily.  Recommend low sodium diet and smoking cessation.   Skin lesion Most of his skin lesions have appearance or SK, reassured.  He doesn't wish to pursue tx at this time.  Will continue to monitor for now.     Meds ordered this encounter  Medications  . lisinopril (ZESTRIL) 20 MG tablet    Sig: Take 1 tablet (20 mg total) by mouth daily.    Dispense:  90 tablet    Refill:  2  . oxyCODONE-acetaminophen (PERCOCET/ROXICET) 5-325 MG tablet    Sig: Take 1 tablet by mouth every 6 (six) hours as needed for severe pain.    Dispense:  120 tablet    Refill:  0    No follow-ups on file.    This visit occurred during the SARS-CoV-2 public health emergency.  Safety protocols were in place, including screening questions prior to the visit, additional usage of staff PPE, and extensive cleaning of exam room while observing appropriate contact time as indicated for disinfecting solutions.

## 2019-11-11 NOTE — Assessment & Plan Note (Signed)
BP elevated today.  Increase lisinopril to 20mg  daily.  Recommend low sodium diet and smoking cessation.

## 2019-11-11 NOTE — Patient Instructions (Addendum)
Continue to monitor skin lesions, let me know if you see any further changes.  Increase lisinopril to 20mg  for blood pressure.  We'll plan to follow up again in about 3 months.

## 2019-11-11 NOTE — Assessment & Plan Note (Signed)
He continues to do well with current dosing of percocet.  Opioid risk score of 170. Updated pain contract signed today.  PDMP reviewed.  Continue opioid at current strength

## 2019-11-11 NOTE — Telephone Encounter (Signed)
Opioid treatment agreement completed.   Given to [employer] for scanning.

## 2019-12-22 ENCOUNTER — Encounter: Payer: Self-pay | Admitting: Family Medicine

## 2019-12-23 ENCOUNTER — Other Ambulatory Visit: Payer: Self-pay | Admitting: Family Medicine

## 2019-12-23 MED ORDER — OXYCODONE-ACETAMINOPHEN 5-325 MG PO TABS
1.0000 | ORAL_TABLET | Freq: Four times a day (QID) | ORAL | 0 refills | Status: DC | PRN
Start: 2019-12-23 — End: 2020-11-02

## 2020-01-22 ENCOUNTER — Encounter: Payer: Self-pay | Admitting: Family Medicine

## 2020-01-23 ENCOUNTER — Other Ambulatory Visit: Payer: Self-pay | Admitting: Family Medicine

## 2020-01-23 MED ORDER — OXYCODONE-ACETAMINOPHEN 5-325 MG PO TABS
1.0000 | ORAL_TABLET | Freq: Four times a day (QID) | ORAL | 0 refills | Status: DC | PRN
Start: 2020-01-23 — End: 2020-02-18

## 2020-02-18 ENCOUNTER — Encounter: Payer: Self-pay | Admitting: Family Medicine

## 2020-02-18 ENCOUNTER — Other Ambulatory Visit: Payer: Self-pay | Admitting: Family Medicine

## 2020-02-18 MED ORDER — OXYCODONE-ACETAMINOPHEN 5-325 MG PO TABS: 1.0000 | ORAL_TABLET | Freq: Four times a day (QID) | ORAL | 0 refills | Status: DC | PRN

## 2020-03-03 ENCOUNTER — Other Ambulatory Visit: Payer: Self-pay

## 2020-03-03 ENCOUNTER — Encounter: Payer: Self-pay | Admitting: Family Medicine

## 2020-03-03 ENCOUNTER — Ambulatory Visit (INDEPENDENT_AMBULATORY_CARE_PROVIDER_SITE_OTHER): Admitting: Family Medicine

## 2020-03-03 ENCOUNTER — Other Ambulatory Visit (INDEPENDENT_AMBULATORY_CARE_PROVIDER_SITE_OTHER)

## 2020-03-03 VITALS — BP 134/83 | HR 66 | Wt 215.0 lb

## 2020-03-03 DIAGNOSIS — E782 Mixed hyperlipidemia: Secondary | ICD-10-CM | POA: Diagnosis not present

## 2020-03-03 DIAGNOSIS — E038 Other specified hypothyroidism: Secondary | ICD-10-CM | POA: Diagnosis not present

## 2020-03-03 DIAGNOSIS — M47816 Spondylosis without myelopathy or radiculopathy, lumbar region: Secondary | ICD-10-CM

## 2020-03-03 DIAGNOSIS — I10 Essential (primary) hypertension: Secondary | ICD-10-CM | POA: Diagnosis not present

## 2020-03-03 DIAGNOSIS — Z23 Encounter for immunization: Secondary | ICD-10-CM

## 2020-03-03 DIAGNOSIS — E063 Autoimmune thyroiditis: Secondary | ICD-10-CM

## 2020-03-03 DIAGNOSIS — Z125 Encounter for screening for malignant neoplasm of prostate: Secondary | ICD-10-CM

## 2020-03-03 DIAGNOSIS — Z79891 Long term (current) use of opiate analgesic: Secondary | ICD-10-CM | POA: Diagnosis not present

## 2020-03-03 LAB — COMPLETE METABOLIC PANEL WITH GFR
AG Ratio: 2 (calc) (ref 1.0–2.5)
ALT: 26 U/L (ref 9–46)
AST: 20 U/L (ref 10–35)
Albumin: 4.5 g/dL (ref 3.6–5.1)
Alkaline phosphatase (APISO): 40 U/L (ref 35–144)
BUN: 11 mg/dL (ref 7–25)
CO2: 29 mmol/L (ref 20–32)
Calcium: 9.3 mg/dL (ref 8.6–10.3)
Chloride: 99 mmol/L (ref 98–110)
Creat: 0.81 mg/dL (ref 0.70–1.33)
GFR, Est African American: 119 mL/min/{1.73_m2} (ref >=60)
GFR, Est Non African American: 103 mL/min/{1.73_m2} (ref >=60)
Globulin: 2.3 g/dL (calc) (ref 1.9–3.7)
Glucose, Bld: 101 mg/dL — ABNORMAL HIGH (ref 65–99)
Potassium: 4.5 mmol/L (ref 3.5–5.3)
Sodium: 135 mmol/L (ref 135–146)
Total Bilirubin: 0.8 mg/dL (ref 0.2–1.2)
Total Protein: 6.8 g/dL (ref 6.1–8.1)

## 2020-03-03 LAB — TSH: TSH: 0.8 mIU/L (ref 0.40–4.50)

## 2020-03-03 LAB — LIPID PANEL
Cholesterol: 245 mg/dL — ABNORMAL HIGH (ref <200)
HDL: 57 mg/dL (ref >=40)
LDL Cholesterol (Calc): 156 mg/dL (calc) — ABNORMAL HIGH
Non-HDL Cholesterol (Calc): 188 mg/dL (calc) — ABNORMAL HIGH (ref <130)
Total CHOL/HDL Ratio: 4.3 (calc) (ref <5.0)
Triglycerides: 184 mg/dL — ABNORMAL HIGH (ref <150)

## 2020-03-03 LAB — PSA: PSA: 1.02 ng/mL (ref <=4.0)

## 2020-03-03 MED ORDER — SYNTHROID 175 MCG PO TABS: ORAL_TABLET | ORAL | 2 refills | Status: DC

## 2020-03-03 NOTE — Assessment & Plan Note (Signed)
Feels good at current dose of levothyroxine.  Update TSH today.   ?

## 2020-03-03 NOTE — Patient Instructions (Signed)
Great to see you today! Have labs completed See me again in about 6 months.

## 2020-03-03 NOTE — Assessment & Plan Note (Signed)
BP remains fairly well controlled with lisinopril. Continue at current strength.  Update labs.  Continue to work on quitting smoking.

## 2020-03-03 NOTE — Assessment & Plan Note (Addendum)
Doing well with atorvastatin, continue at current strength.   Update lipid panel.

## 2020-03-03 NOTE — Assessment & Plan Note (Signed)
He continues to do well with percocet.  Updated UDS ordered.  PDMP reviewed.  Continue chronic percocet at current strength.

## 2020-03-03 NOTE — Addendum Note (Signed)
Addended by: Perlie Mayo on: 03/03/2020 08:43 AM   Modules accepted: Orders

## 2020-03-03 NOTE — Progress Notes (Signed)
Reginald Salazar - 50 y.o. male MRN [id number]  Date of birth: 1970-04-07  Subjective Chief Complaint  Patient presents with  . Hypertension  . Hypothyroidism  . Hyperlipidemia    HPI Reginald Salazar is a 50 y.o. male here today for follow up of HTN, hypothyroidism and HLD.  HTN currently managed with lisinopril 20mg  daily.  He is doing well with this and denies side effects.  BP has remained well controlled.  He has not had chest pain, shortness of breath, palpitations, headache or vision changes.  He is working on cutting back on smoking.    Tolerating atorvastatin well for management of HLD.  Denies myalgias at current strength.    Feels good at current dose of levothyroxine.  Due for updated labs.    He is taking percocet 5/325mg  q6 hours as needed for chronic low back pain.  He has history of post laminectomy pain syndrome and this is continuation of what his neurosurgeon had him on.  He is doing well with this without significant side effects.    ROS:  A comprehensive ROS was completed and negative except as noted per HPI  No Known Allergies  Past Medical History:  Diagnosis Date  . Hernia, abdominal 2008  . Hyperlipidemia   . Hypertension   . Hypothyroidism   . Sleep apnea    CPAP  . Smoker   . Thyroid disease     Past Surgical History:  Procedure Laterality Date  . BACK SURGERY  2017    Social History   Socioeconomic History  . Marital status: Married    Spouse name: Not on file  . Number of children: Not on file  . Years of education: Not on file  . Highest education level: Not asked  Occupational History  . Occupation: Retired  Tobacco Use  . Smoking status: Current Every Day Smoker    Packs/day: 0.75    Years: 30.00    Pack years: 22.50    Types: Cigarettes  . Smokeless tobacco: Never Used  Vaping Use  . Vaping Use: Never used  Substance and Sexual Activity  . Alcohol use: Yes    Alcohol/week: 12.0 standard drinks    Types: 12 Cans of beer  per week  . Drug use: Not Currently  . Sexual activity: Yes    Partners: Female  Other Topics Concern  . Not on file  Social History Narrative  . Not on file   Social Determinants of Health   Financial Resource Strain: Not on file  Food Insecurity: Not on file  Transportation Needs: Not on file  Physical Activity: Not on file  Stress: Not on file  Social Connections: Not on file    Family History  Problem Relation Age of Onset  . Rheum arthritis Mother   . Mitral valve prolapse Mother   . Stomach cancer Maternal Grandmother   . Colon cancer Neg Hx   . Colon polyps Neg Hx   . Esophageal cancer Neg Hx   . Rectal cancer Neg Hx     Health Maintenance  Topic Date Due  . Hepatitis C Screening  Never done  . HIV Screening  Never done  . INFLUENZA VACCINE  Never done  . COVID-19 Vaccine (3 - Booster for Pfizer series) 11/18/2019  . COLONOSCOPY (Pts 45-36yrs Insurance coverage will need to be confirmed)  11/19/2021  . TETANUS/TDAP  03/21/2027     ----------------------------------------------------------------------------------------------------------------------------------------------------------------------------------------------------------------- Physical Exam BP 134/83   Pulse 66   Wt 215 lb (97.5  kg)   SpO2 98%   BMI 31.84 kg/m   Physical Exam Constitutional:      Appearance: Normal appearance.  HENT:     Head: Normocephalic and atraumatic.  Eyes:     General: No scleral icterus. Cardiovascular:     Rate and Rhythm: Normal rate and regular rhythm.  Pulmonary:     Effort: Pulmonary effort is normal.     Breath sounds: Normal breath sounds.  Musculoskeletal:     Cervical back: Neck supple.  Neurological:     General: No focal deficit present.     Mental Status: He is alert.  Psychiatric:        Mood and Affect: Mood normal.        Behavior: Behavior normal.      ------------------------------------------------------------------------------------------------------------------------------------------------------------------------------------------------------------------- Assessment and Plan  Hypothyroidism Feels good at current dose of levothyroxine.  Update TSH today.   Hypertension BP remains fairly well controlled with lisinopril. Continue at current strength.  Update labs.  Continue to work on quitting smoking.    Mixed hyperlipidemia Doing well with atorvastatin, continue at current strength.   Update lipid panel.   Lumbar spondylosis He continues to do well with percocet.  Updated UDS ordered.  PDMP reviewed.  Continue chronic percocet at current strength.     Meds ordered this encounter  Medications  . SYNTHROID 175 MCG tablet    Sig: Take #1/2 tablet on Saturday and #1 tablet Sunday-Friday.    Dispense:  90 tablet    Refill:  2    Please dispense brand name of Synthroid . Levothyroxine causes hair loss.    Return in about 6 months (around 08/31/2020) for HTN/Hypothyroid.    This visit occurred during the SARS-CoV-2 public health emergency.  Safety protocols were in place, including screening questions prior to the visit, additional usage of staff PPE, and extensive cleaning of exam room while observing appropriate contact time as indicated for disinfecting solutions.

## 2020-03-04 LAB — DRUG MONITOR, PANEL 5, SCREEN, URINE
Amphetamines: NEGATIVE ng/mL (ref <500)
Barbiturates: NEGATIVE ng/mL (ref <300)
Benzodiazepines: NEGATIVE ng/mL (ref <100)
Cocaine Metabolite: NEGATIVE ng/mL (ref <150)
Creatinine: 121.4 mg/dL
Marijuana Metabolite: NEGATIVE ng/mL (ref <20)
Methadone Metabolite: NEGATIVE ng/mL (ref <100)
Opiates: POSITIVE ng/mL — AB (ref <100)
Oxidant: NEGATIVE ug/mL
Oxycodone: POSITIVE ng/mL — AB (ref <100)
pH: 6.1 (ref 4.5–9.0)

## 2020-03-04 LAB — DM TEMPLATE

## 2020-03-23 ENCOUNTER — Encounter: Payer: Self-pay | Admitting: Family Medicine

## 2020-03-24 ENCOUNTER — Other Ambulatory Visit: Payer: Self-pay | Admitting: Family Medicine

## 2020-03-24 MED ORDER — OXYCODONE-ACETAMINOPHEN 5-325 MG PO TABS
1.0000 | ORAL_TABLET | Freq: Four times a day (QID) | ORAL | 0 refills | Status: DC | PRN
Start: 2020-03-24 — End: 2020-04-23

## 2020-04-15 ENCOUNTER — Other Ambulatory Visit: Payer: Self-pay

## 2020-04-15 MED ORDER — ALBUTEROL SULFATE HFA 108 (90 BASE) MCG/ACT IN AERS: 2.0000 | INHALATION_SPRAY | Freq: Four times a day (QID) | RESPIRATORY_TRACT | 3 refills | Status: DC | PRN

## 2020-04-22 ENCOUNTER — Encounter: Payer: Self-pay | Admitting: Family Medicine

## 2020-04-23 MED ORDER — OXYCODONE-ACETAMINOPHEN 5-325 MG PO TABS
1.0000 | ORAL_TABLET | Freq: Four times a day (QID) | ORAL | 0 refills | Status: DC | PRN
Start: 2020-04-23 — End: 2020-05-24

## 2020-05-24 ENCOUNTER — Encounter: Payer: Self-pay | Admitting: Family Medicine

## 2020-05-24 MED ORDER — OXYCODONE-ACETAMINOPHEN 5-325 MG PO TABS
1.0000 | ORAL_TABLET | Freq: Four times a day (QID) | ORAL | 0 refills | Status: DC | PRN
Start: 2020-05-24 — End: 2020-06-24

## 2020-06-23 ENCOUNTER — Encounter: Payer: Self-pay | Admitting: Family Medicine

## 2020-06-24 MED ORDER — OXYCODONE-ACETAMINOPHEN 5-325 MG PO TABS: 1.0000 | ORAL_TABLET | Freq: Four times a day (QID) | ORAL | 0 refills | Status: DC | PRN

## 2020-07-15 ENCOUNTER — Ambulatory Visit

## 2020-07-19 ENCOUNTER — Ambulatory Visit (INDEPENDENT_AMBULATORY_CARE_PROVIDER_SITE_OTHER): Admitting: Sports Medicine

## 2020-07-19 ENCOUNTER — Other Ambulatory Visit: Payer: Self-pay

## 2020-07-19 VITALS — BP 129/67 | HR 84 | Resp 20 | Ht 68.9 in | Wt 216.0 lb

## 2020-07-19 DIAGNOSIS — Z23 Encounter for immunization: Secondary | ICD-10-CM

## 2020-07-19 NOTE — Progress Notes (Signed)
Established Patient Office Visit  Subjective:  Patient ID: Reginald Salazar, male    DOB: 11-03-1970  Age: 50 y.o. MRN: [id number]  CC:  Chief Complaint  Patient presents with   Immunizations    HPI Reginald Salazar presents for a shingles vaccine. Shingrix given in left deltoid. Pt tolerated well with no apparent complications.  Past Medical History:  Diagnosis Date   Hernia, abdominal 2008   Hyperlipidemia    Hypertension    Hypothyroidism    Sleep apnea    CPAP   Smoker    Thyroid disease     Past Surgical History:  Procedure Laterality Date   BACK SURGERY  2017    Family History  Problem Relation Age of Onset   Rheum arthritis Mother    Mitral valve prolapse Mother    Stomach cancer Maternal Grandmother    Colon cancer Neg Hx    Colon polyps Neg Hx    Esophageal cancer Neg Hx    Rectal cancer Neg Hx     Social History   Socioeconomic History   Marital status: Married    Spouse name: Not on file   Number of children: Not on file   Years of education: Not on file   Highest education level: Not asked  Occupational History   Occupation: Retired  Tobacco Use   Smoking status: Every Day    Packs/day: 0.75    Years: 30.00    Pack years: 22.50    Types: Cigarettes   Smokeless tobacco: Never  Vaping Use   Vaping Use: Never used  Substance and Sexual Activity   Alcohol use: Yes    Alcohol/week: 12.0 standard drinks    Types: 12 Cans of beer per week   Drug use: Not Currently   Sexual activity: Yes    Partners: Female  Other Topics Concern   Not on file  Social History Narrative   Not on file   Social Determinants of Health   Financial Resource Strain: Not on file  Food Insecurity: Not on file  Transportation Needs: Not on file  Physical Activity: Not on file  Stress: Not on file  Social Connections: Not on file  Intimate Partner Violence: Not on file    Outpatient Medications Prior to Visit  Medication Sig Dispense Refill    albuterol (VENTOLIN HFA) 108 (90 Base) MCG/ACT inhaler Inhale 2 puffs into the lungs every 6 (six) hours as needed for wheezing or shortness of breath. 6.7 g 3   atorvastatin (LIPITOR) 20 MG tablet Take 1 tablet (20 mg total) by mouth daily. 90 tablet 3   diclofenac sodium (VOLTAREN) 1 % GEL Apply 2 g topically 4 (four) times daily. 150 g 1   lisinopril (ZESTRIL) 20 MG tablet Take 1 tablet (20 mg total) by mouth daily. 90 tablet 2   Multiple Vitamin (MULTIVITAMIN) tablet Take 1 tablet by mouth daily.     oxyCODONE-acetaminophen (PERCOCET/ROXICET) 5-325 MG tablet Take 1 tablet by mouth every 6 (six) hours as needed for severe pain. 120 tablet 0   SYNTHROID 175 MCG tablet Take #1/2 tablet on Saturday and #1 tablet Sunday-Friday. 90 tablet 2   No facility-administered medications prior to visit.    No Known Allergies  ROS Review of Systems    Objective:    Physical Exam  BP 129/67 (BP Location: Left Arm, Patient Position: Sitting, Cuff Size: Large)   Pulse 84   Resp 20   Ht 5' 8.9" (1.75 m)   Wt 216  lb (98 kg)   SpO2 99%   BMI 31.99 kg/m  Wt Readings from Last 3 Encounters:  07/19/20 216 lb (98 kg)  03/03/20 215 lb (97.5 kg)  11/11/19 216 lb 9.6 oz (98.2 kg)     Health Maintenance Due  Topic Date Due   Pneumococcal Vaccine 3-86 Years old (1 - PCV) Never done   HIV Screening  Never done   Hepatitis C Screening  Never done   COVID-19 Vaccine (3 - Pfizer risk series) 06/16/2019    There are no preventive care reminders to display for this patient.  Lab Results  Component Value Date   TSH 0.80 03/03/2020   Lab Results  Component Value Date   WBC 5.2 06/12/2019   HGB 16.4 06/12/2019   HCT 48.2 06/12/2019   MCV 91.3 06/12/2019   PLT 256 06/12/2019   Lab Results  Component Value Date   NA 135 03/03/2020   K 4.5 03/03/2020   CO2 29 03/03/2020   GLUCOSE 101 (H) 03/03/2020   BUN 11 03/03/2020   CREATININE 0.81 03/03/2020   BILITOT 0.8 03/03/2020   AST 20  03/03/2020   ALT 26 03/03/2020   PROT 6.8 03/03/2020   CALCIUM 9.3 03/03/2020   Lab Results  Component Value Date   CHOL 245 (H) 03/03/2020   Lab Results  Component Value Date   HDL 57 03/03/2020   Lab Results  Component Value Date   LDLCALC 156 (H) 03/03/2020   Lab Results  Component Value Date   TRIG 184 (H) 03/03/2020   Lab Results  Component Value Date   CHOLHDL 4.3 03/03/2020   No results found for: HGBA1C    Assessment & Plan:  Shingrix given in left deltoid. Pt tolerated well with no apparent complications. Problem List Items Addressed This Visit   None Visit Diagnoses     Need for shingles vaccine    -  Primary   Relevant Orders   Varicella-zoster vaccine IM (Shingrix) (Completed)       No orders of the defined types were placed in this encounter.   Follow-up: No follow-ups on file.    Ninfa Meeker, CMA

## 2020-07-26 ENCOUNTER — Encounter: Payer: Self-pay | Admitting: Family Medicine

## 2020-07-27 ENCOUNTER — Other Ambulatory Visit: Payer: Self-pay | Admitting: Family Medicine

## 2020-07-27 MED ORDER — OXYCODONE-ACETAMINOPHEN 5-325 MG PO TABS
1.0000 | ORAL_TABLET | Freq: Four times a day (QID) | ORAL | 0 refills | Status: DC | PRN
Start: 2020-07-27 — End: 2020-08-23

## 2020-08-11 ENCOUNTER — Encounter: Payer: Self-pay | Admitting: Family Medicine

## 2020-08-12 ENCOUNTER — Other Ambulatory Visit: Payer: Self-pay | Admitting: *Deleted

## 2020-08-12 MED ORDER — ALBUTEROL SULFATE HFA 108 (90 BASE) MCG/ACT IN AERS: 2.0000 | INHALATION_SPRAY | Freq: Four times a day (QID) | RESPIRATORY_TRACT | 3 refills | Status: DC | PRN

## 2020-08-23 ENCOUNTER — Other Ambulatory Visit: Payer: Self-pay | Admitting: Family Medicine

## 2020-08-24 MED ORDER — OXYCODONE-ACETAMINOPHEN 5-325 MG PO TABS
1.0000 | ORAL_TABLET | Freq: Four times a day (QID) | ORAL | 0 refills | Status: DC | PRN
Start: 2020-08-24 — End: 2020-09-27

## 2020-09-26 ENCOUNTER — Encounter: Payer: Self-pay | Admitting: Family Medicine

## 2020-09-28 MED ORDER — OXYCODONE-ACETAMINOPHEN 5-325 MG PO TABS
1.0000 | ORAL_TABLET | Freq: Four times a day (QID) | ORAL | 0 refills | Status: DC | PRN
Start: 2020-09-28 — End: 2020-10-28

## 2020-09-28 NOTE — Telephone Encounter (Signed)
Left voicemail message for patient to call back to get this appointment scheduled. AM

## 2020-09-28 NOTE — Telephone Encounter (Signed)
Rx sent in.  Due for follow up, please schedule.  Thanks!  CM

## 2020-10-05 DIAGNOSIS — M1990 Unspecified osteoarthritis, unspecified site: Secondary | ICD-10-CM | POA: Insufficient documentation

## 2020-10-05 DIAGNOSIS — F32A Depression, unspecified: Secondary | ICD-10-CM | POA: Insufficient documentation

## 2020-10-05 DIAGNOSIS — I059 Rheumatic mitral valve disease, unspecified: Secondary | ICD-10-CM | POA: Insufficient documentation

## 2020-10-05 DIAGNOSIS — F1010 Alcohol abuse, uncomplicated: Secondary | ICD-10-CM | POA: Insufficient documentation

## 2020-10-06 ENCOUNTER — Ambulatory Visit (INDEPENDENT_AMBULATORY_CARE_PROVIDER_SITE_OTHER): Admitting: Family Medicine

## 2020-10-06 ENCOUNTER — Encounter: Payer: Self-pay | Admitting: Family Medicine

## 2020-10-06 ENCOUNTER — Other Ambulatory Visit: Payer: Self-pay

## 2020-10-06 VITALS — BP 124/84 | HR 80 | Temp 97.8°F | Ht 68.0 in | Wt 211.5 lb

## 2020-10-06 DIAGNOSIS — E782 Mixed hyperlipidemia: Secondary | ICD-10-CM

## 2020-10-06 DIAGNOSIS — M47816 Spondylosis without myelopathy or radiculopathy, lumbar region: Secondary | ICD-10-CM | POA: Diagnosis not present

## 2020-10-06 DIAGNOSIS — I10 Essential (primary) hypertension: Secondary | ICD-10-CM | POA: Diagnosis not present

## 2020-10-06 DIAGNOSIS — E038 Other specified hypothyroidism: Secondary | ICD-10-CM

## 2020-10-06 DIAGNOSIS — E063 Autoimmune thyroiditis: Secondary | ICD-10-CM

## 2020-10-06 MED ORDER — PREDNISONE 50 MG PO TABS: ORAL_TABLET | ORAL | 0 refills | Status: DC

## 2020-10-06 MED ORDER — LISINOPRIL 20 MG PO TABS
20.0000 mg | ORAL_TABLET | Freq: Every day | ORAL | 2 refills | Status: DC
Start: 2020-10-06 — End: 2020-10-14

## 2020-10-06 NOTE — Assessment & Plan Note (Signed)
Continues to do well with Percocet at current strength.  No side effects at this time.  He has been able to cut back on use of this some.  PDMP reviewed with opioid risk score of 160.

## 2020-10-06 NOTE — Progress Notes (Signed)
Reginald Salazar - 50 y.o. male MRN [id number]  Date of birth: [birth year]  Subjective Chief Complaint  Patient presents with   Back Pain    f   Leg Pain   Immunizations    HPI Reginald Salazar is a 50 year old male here today for follow-up visit.  He has a history of hypertension, hyperlipidemia hypothyroidism as well as lumbar spondylosis.  He is treated with lisinopril for management of hypertension.  He is doing well with this with good control of blood pressure.  He denies side effects related to current medication.  He has not had chest pain, shortness of breath, palpitations, headache or vision changes.  He continues with levothyroxine 175 mcg daily.  He feels like this is working well for him.  He is due for updated thyroid testing.  He has had elevated lipids and is prescribed atorvastatin however has not been taking this.  He continues on oxycodone as needed for management of continued low back pain.  He is not having any side effects from Percocet.  This continues to work well for management of his pain.  He has been able to cut back on this some.  ROS:  A comprehensive ROS was completed and negative except as noted per HPI  No Known Allergies  Past Medical History:  Diagnosis Date   Hernia, abdominal 2008   Hyperlipidemia    Hypertension    Hypothyroidism    Sleep apnea    CPAP   Smoker    Thyroid disease     Past Surgical History:  Procedure Laterality Date   BACK SURGERY  2017    Social History   Socioeconomic History   Marital status: Married    Spouse name: Not on file   Number of children: Not on file   Years of education: Not on file   Highest education level: Not asked  Occupational History   Occupation: Retired  Tobacco Use   Smoking status: Every Day    Packs/day: 0.75    Years: 30.00    Pack years: 22.50    Types: Cigarettes   Smokeless tobacco: Never  Vaping Use   Vaping Use: Never used  Substance and Sexual Activity   Alcohol use: Yes     Alcohol/week: 12.0 standard drinks    Types: 12 Cans of beer per week   Drug use: Not Currently   Sexual activity: Yes    Partners: Female  Other Topics Concern   Not on file  Social History Narrative   Not on file   Social Determinants of Health   Financial Resource Strain: Not on file  Food Insecurity: Not on file  Transportation Needs: Not on file  Physical Activity: Not on file  Stress: Not on file  Social Connections: Not on file    Family History  Problem Relation Age of Onset   Rheum arthritis Mother    Mitral valve prolapse Mother    Stomach cancer Maternal Grandmother    Colon cancer Neg Hx    Colon polyps Neg Hx    Esophageal cancer Neg Hx    Rectal cancer Neg Hx     Health Maintenance  Topic Date Due   HIV Screening  Never done   Hepatitis C Screening  Never done   Pneumococcal Vaccine 37-22 Years old (2 - PCV) 03/20/2018   COVID-19 Vaccine (3 - Pfizer risk series) 06/16/2019   INFLUENZA VACCINE  05/06/2021 (Originally 09/06/2020)   COLONOSCOPY (Pts 45-80yr Insurance coverage will need to be confirmed)  11/19/2021   TETANUS/TDAP  03/21/2027   Zoster Vaccines- Shingrix  Completed   HPV VACCINES  Aged Out     ----------------------------------------------------------------------------------------------------------------------------------------------------------------------------------------------------------------- Physical Exam BP 124/84 (BP Location: Left Arm, Patient Position: Sitting, Cuff Size: Normal)   Pulse 80   Temp 97.8 F (36.6 C)   Ht '5\' 8"'$  (1.727 m)   Wt 211 lb 8 oz (95.9 kg)   SpO2 99%   BMI 32.16 kg/m   Physical Exam Constitutional:      Appearance: Normal appearance.  HENT:     Head: Normocephalic and atraumatic.  Eyes:     General: No scleral icterus. Cardiovascular:     Rate and Rhythm: Normal rate and regular rhythm.  Pulmonary:     Effort: Pulmonary effort is normal.     Breath sounds: Normal breath sounds.   Musculoskeletal:     Cervical back: Neck supple.  Neurological:     General: No focal deficit present.     Mental Status: He is alert.  Psychiatric:        Mood and Affect: Mood normal.        Behavior: Behavior normal.    ------------------------------------------------------------------------------------------------------------------------------------------------------------------------------------------------------------------- Assessment and Plan  Hypertension Blood pressure remains well controlled with lisinopril.  Recommend continuation of current strength.  Hypothyroidism Update TSH today.  Lumbar spondylosis Continues to do well with Percocet at current strength.  No side effects at this time.  He has been able to cut back on use of this some.  PDMP reviewed with opioid risk score of 160.  Mixed hyperlipidemia Prescribed atorvastatin however has not taken.  Update lipid panel.   Meds ordered this encounter  Medications   lisinopril (ZESTRIL) 20 MG tablet    Sig: Take 1 tablet (20 mg total) by mouth daily.    Dispense:  90 tablet    Refill:  2   predniSONE (DELTASONE) 50 MG tablet    Sig: Take 1 tab po daily x5 days    Dispense:  5 tablet    Refill:  0    Return in about 6 months (around 04/05/2021) for HTN/Back pain.    This visit occurred during the SARS-CoV-2 public health emergency.  Safety protocols were in place, including screening questions prior to the visit, additional usage of staff PPE, and extensive cleaning of exam room while observing appropriate contact time as indicated for disinfecting solutions.

## 2020-10-06 NOTE — Assessment & Plan Note (Signed)
Update TSH today. 

## 2020-10-06 NOTE — Assessment & Plan Note (Signed)
Blood pressure remains well controlled with lisinopril.  Recommend continuation of current strength.

## 2020-10-06 NOTE — Assessment & Plan Note (Signed)
Prescribed atorvastatin however has not taken.  Update lipid panel.

## 2020-10-07 LAB — COMPLETE METABOLIC PANEL WITH GFR
AG Ratio: 1.8 (calc) (ref 1.0–2.5)
ALT: 21 U/L (ref 9–46)
AST: 19 U/L (ref 10–35)
Albumin: 4.8 g/dL (ref 3.6–5.1)
Alkaline phosphatase (APISO): 48 U/L (ref 35–144)
BUN: 10 mg/dL (ref 7–25)
CO2: 30 mmol/L (ref 20–32)
Calcium: 9.6 mg/dL (ref 8.6–10.3)
Chloride: 99 mmol/L (ref 98–110)
Creat: 0.87 mg/dL (ref 0.70–1.30)
Globulin: 2.6 g/dL (calc) (ref 1.9–3.7)
Glucose, Bld: 106 mg/dL — ABNORMAL HIGH (ref 65–99)
Potassium: 4.7 mmol/L (ref 3.5–5.3)
Sodium: 136 mmol/L (ref 135–146)
Total Bilirubin: 0.8 mg/dL (ref 0.2–1.2)
Total Protein: 7.4 g/dL (ref 6.1–8.1)
eGFR: 104 mL/min/{1.73_m2} (ref >=60)

## 2020-10-07 LAB — LIPID PANEL W/REFLEX DIRECT LDL
Cholesterol: 263 mg/dL — ABNORMAL HIGH (ref <200)
HDL: 67 mg/dL (ref >=40)
LDL Cholesterol (Calc): 170 mg/dL (calc) — ABNORMAL HIGH
Non-HDL Cholesterol (Calc): 196 mg/dL (calc) — ABNORMAL HIGH (ref <130)
Total CHOL/HDL Ratio: 3.9 (calc) (ref <5.0)
Triglycerides: 126 mg/dL (ref <150)

## 2020-10-07 LAB — TSH: TSH: 4.03 mIU/L (ref 0.40–4.50)

## 2020-10-14 ENCOUNTER — Other Ambulatory Visit: Payer: Self-pay

## 2020-10-14 MED ORDER — LISINOPRIL 20 MG PO TABS
20.0000 mg | ORAL_TABLET | Freq: Every day | ORAL | 0 refills | Status: DC
Start: 2020-10-14 — End: 2021-03-02

## 2020-10-14 NOTE — Telephone Encounter (Signed)
Received fax from [facility] requesting RX for lisinopril to be filled thru their pharmacy rather than from local pharmacy.  [facility].  Charyl Bigger, CMA

## 2020-11-01 ENCOUNTER — Encounter: Payer: Self-pay | Admitting: Family Medicine

## 2020-11-01 NOTE — Telephone Encounter (Signed)
Pt is requesting "pain meds".  He was previously on oxycodone.  Please review request.  Charyl Bigger, CMA

## 2020-11-02 ENCOUNTER — Other Ambulatory Visit: Payer: Self-pay | Admitting: Family Medicine

## 2020-11-02 MED ORDER — OXYCODONE-ACETAMINOPHEN 5-325 MG PO TABS: 1.0000 | ORAL_TABLET | Freq: Four times a day (QID) | ORAL | 0 refills | Status: DC | PRN

## 2020-11-25 ENCOUNTER — Other Ambulatory Visit: Payer: Self-pay | Admitting: Family Medicine

## 2020-11-30 ENCOUNTER — Encounter: Payer: Self-pay | Admitting: Family Medicine

## 2020-11-30 MED ORDER — OXYCODONE-ACETAMINOPHEN 5-325 MG PO TABS
1.0000 | ORAL_TABLET | Freq: Four times a day (QID) | ORAL | 0 refills | Status: DC | PRN
Start: 2020-11-30 — End: 2020-12-30

## 2021-01-03 ENCOUNTER — Encounter: Payer: Self-pay | Admitting: Family Medicine

## 2021-01-04 MED ORDER — OXYCODONE-ACETAMINOPHEN 5-325 MG PO TABS
1.0000 | ORAL_TABLET | Freq: Four times a day (QID) | ORAL | 0 refills | Status: DC | PRN
Start: 2021-01-04 — End: 2021-02-03

## 2021-01-04 NOTE — Telephone Encounter (Signed)
Rx renewed

## 2021-01-10 IMAGING — DX DG HAND COMPLETE 3+V*L*
3 series · 3 of 3 positions shown · non-contrast
Comparison: None.

CLINICAL DATA: Initial visit for left hand pain. Pt identifies
worse pain at lateral palm between 1st and 2nd digit. Pt states he
may have injured it playing golf.

EXAM:
LEFT HAND - COMPLETE 3+ VIEW

[hand pa]
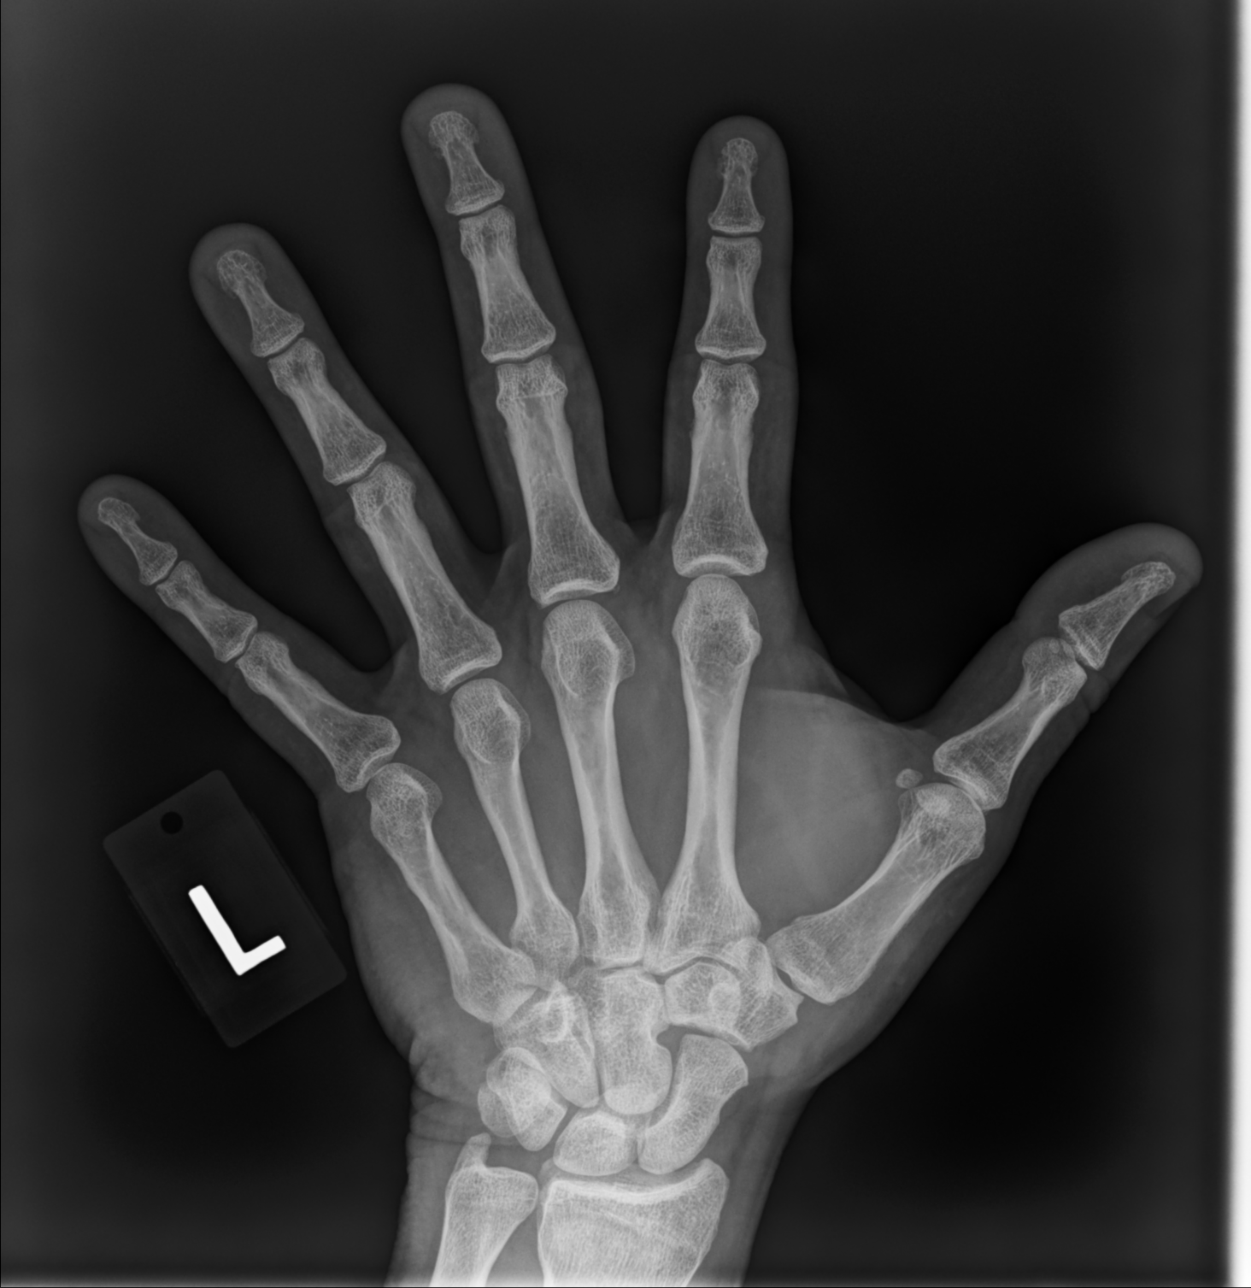

[hand mlo]
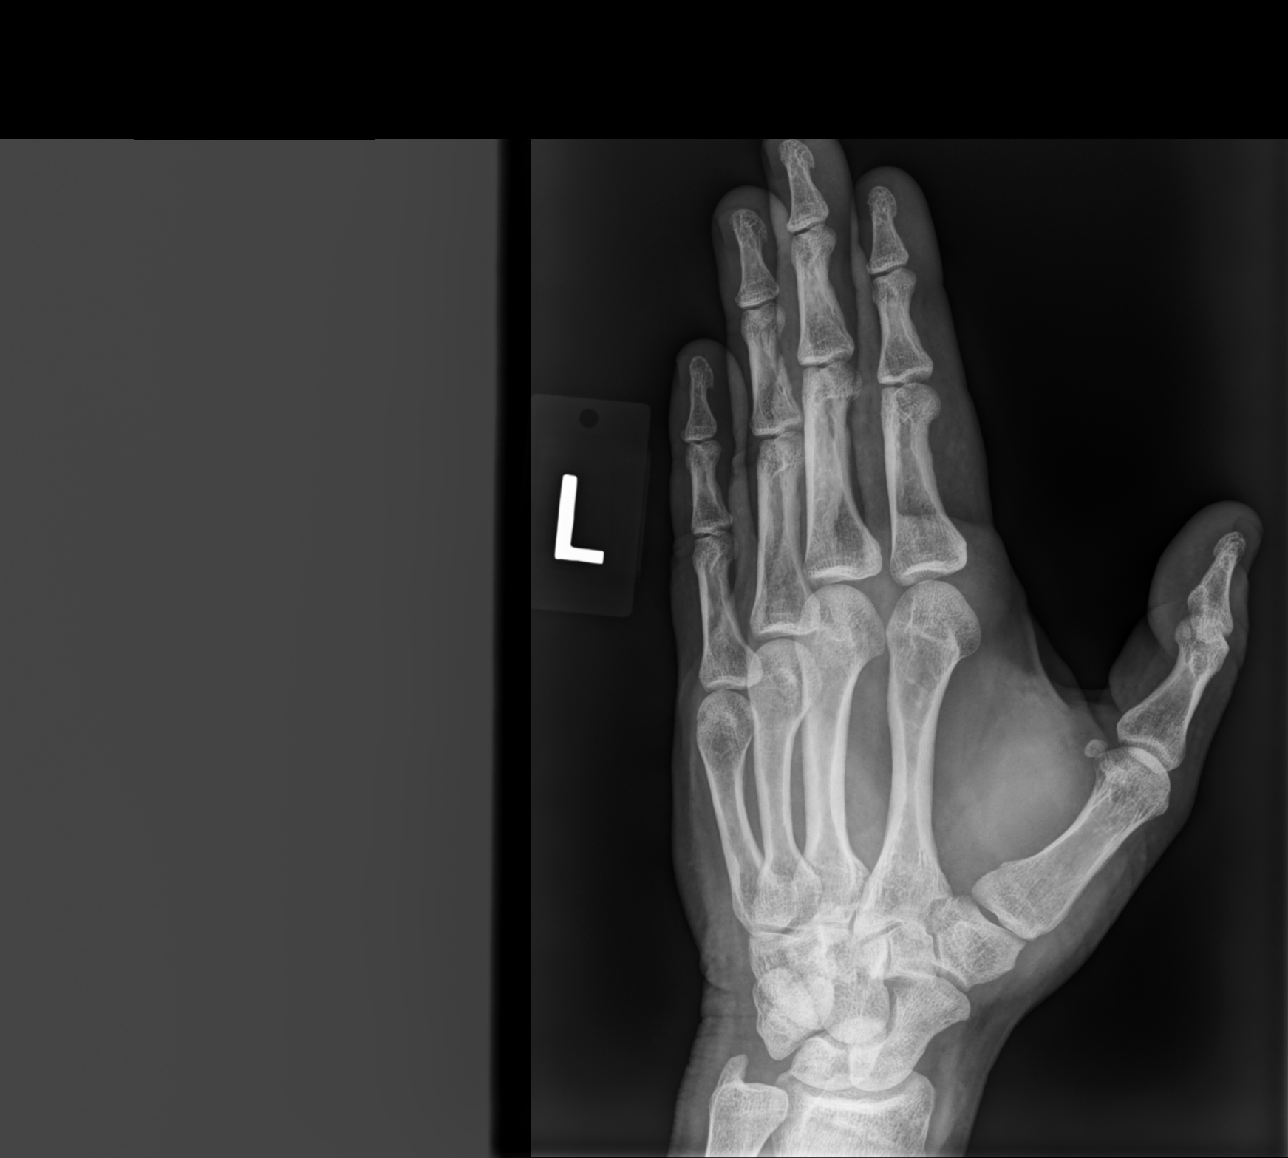

[hand lat]
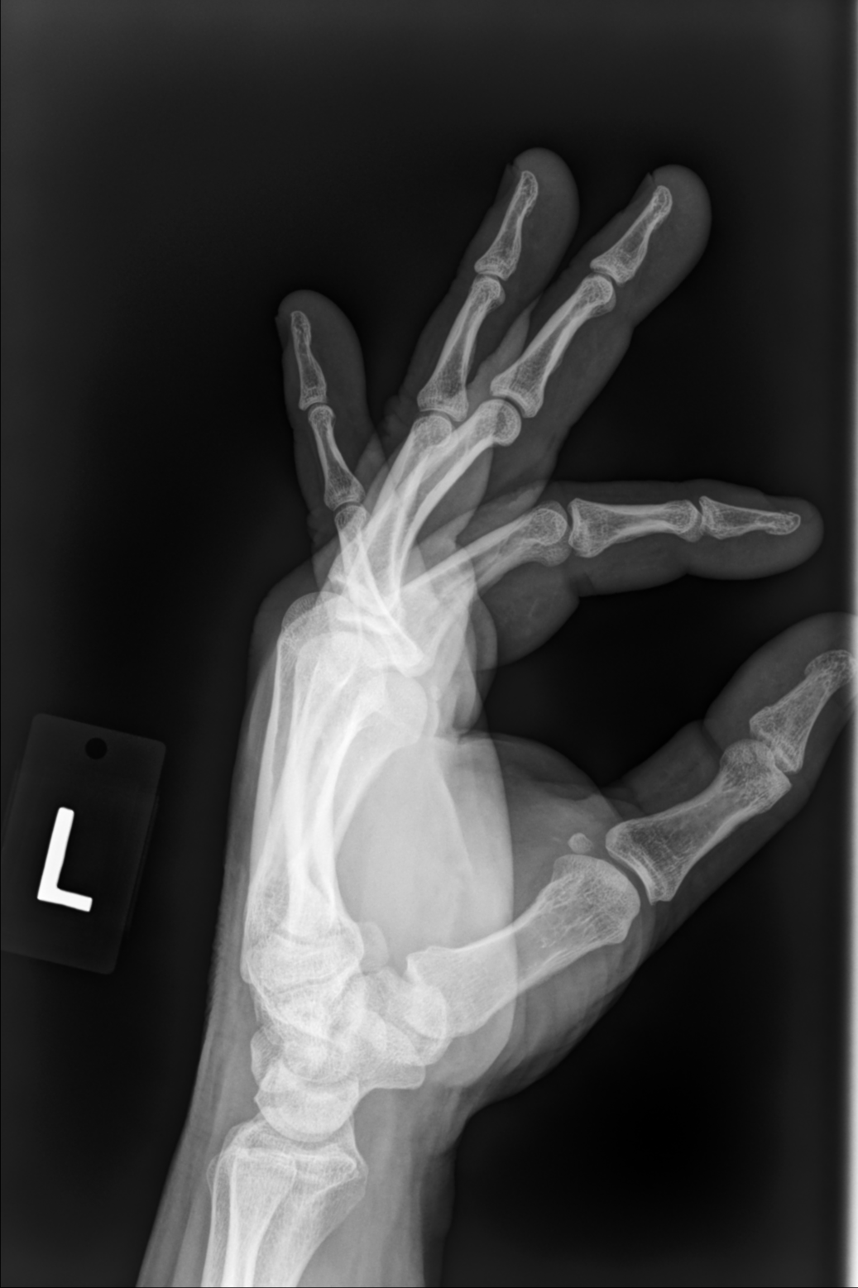

[3 of 3 positions shown; findings below may reference images not displayed]

FINDINGS: There is no evidence of fracture or dislocation. There is no
evidence of arthropathy or other focal bone abnormality. Soft
tissues are unremarkable.
IMPRESSION: No acute osseous abnormality in the left hand.

## 2021-02-03 ENCOUNTER — Encounter: Payer: Self-pay | Admitting: Family Medicine

## 2021-02-03 MED ORDER — OXYCODONE-ACETAMINOPHEN 5-325 MG PO TABS
1.0000 | ORAL_TABLET | Freq: Four times a day (QID) | ORAL | 0 refills | Status: DC | PRN
Start: 2021-02-03 — End: 2021-03-02

## 2021-03-02 ENCOUNTER — Other Ambulatory Visit: Payer: Self-pay | Admitting: Family Medicine

## 2021-03-02 NOTE — Telephone Encounter (Signed)
Please contact the patient to schedule 6 month follow-up.

## 2021-03-03 MED ORDER — OXYCODONE-ACETAMINOPHEN 5-325 MG PO TABS: 1.0000 | ORAL_TABLET | Freq: Four times a day (QID) | ORAL | 0 refills | Status: DC | PRN

## 2021-03-03 NOTE — Telephone Encounter (Signed)
Filled x30 days, please have him schedule prior to next refill.  Thanks!

## 2021-03-04 NOTE — Telephone Encounter (Signed)
Appointment has been made with PCP for 03/07/2021.

## 2021-03-04 NOTE — Telephone Encounter (Signed)
Please contact the patient to schedule an appointment. Thanks in advance!

## 2021-03-07 ENCOUNTER — Other Ambulatory Visit: Payer: Self-pay

## 2021-03-07 ENCOUNTER — Encounter: Payer: Self-pay | Admitting: Family Medicine

## 2021-03-07 ENCOUNTER — Ambulatory Visit (INDEPENDENT_AMBULATORY_CARE_PROVIDER_SITE_OTHER): Admitting: Family Medicine

## 2021-03-07 VITALS — BP 145/89 | HR 75 | Ht 68.0 in | Wt 215.0 lb

## 2021-03-07 DIAGNOSIS — I10 Essential (primary) hypertension: Secondary | ICD-10-CM

## 2021-03-07 DIAGNOSIS — J42 Unspecified chronic bronchitis: Secondary | ICD-10-CM

## 2021-03-07 DIAGNOSIS — E038 Other specified hypothyroidism: Secondary | ICD-10-CM

## 2021-03-07 DIAGNOSIS — E063 Autoimmune thyroiditis: Secondary | ICD-10-CM

## 2021-03-07 DIAGNOSIS — F1190 Opioid use, unspecified, uncomplicated: Secondary | ICD-10-CM | POA: Diagnosis not present

## 2021-03-07 DIAGNOSIS — M47816 Spondylosis without myelopathy or radiculopathy, lumbar region: Secondary | ICD-10-CM

## 2021-03-07 DIAGNOSIS — Z125 Encounter for screening for malignant neoplasm of prostate: Secondary | ICD-10-CM

## 2021-03-07 DIAGNOSIS — E782 Mixed hyperlipidemia: Secondary | ICD-10-CM | POA: Diagnosis not present

## 2021-03-07 MED ORDER — LISINOPRIL 20 MG PO TABS: 20.0000 mg | ORAL_TABLET | Freq: Every day | ORAL | 2 refills | Status: DC

## 2021-03-07 MED ORDER — SYNTHROID 175 MCG PO TABS: ORAL_TABLET | ORAL | 2 refills | Status: DC

## 2021-03-07 MED ORDER — FLUTICASONE-SALMETEROL 250-50 MCG/ACT IN AEPB: 1.0000 | INHALATION_SPRAY | Freq: Two times a day (BID) | RESPIRATORY_TRACT | 3 refills | Status: DC

## 2021-03-07 NOTE — Patient Instructions (Signed)
Let's try adding advair daily.  Continue with albuterol as needed.

## 2021-03-08 LAB — DRUG MONITOR, PANEL 5, SCREEN, URINE
Amphetamines: NEGATIVE ng/mL (ref <500)
Barbiturates: NEGATIVE ng/mL (ref <300)
Benzodiazepines: NEGATIVE ng/mL (ref <100)
Cocaine Metabolite: NEGATIVE ng/mL (ref <150)
Creatinine: 62.8 mg/dL (ref >=20.0)
Marijuana Metabolite: NEGATIVE ng/mL (ref <20)
Methadone Metabolite: NEGATIVE ng/mL (ref <100)
Opiates: NEGATIVE ng/mL (ref <100)
Oxidant: NEGATIVE ug/mL (ref <200)
Oxycodone: POSITIVE ng/mL — AB (ref <100)
pH: 6.7 (ref 4.5–9.0)

## 2021-03-08 LAB — LIPID PANEL W/REFLEX DIRECT LDL
Cholesterol: 242 mg/dL — ABNORMAL HIGH (ref <200)
HDL: 54 mg/dL (ref >=40)
LDL Cholesterol (Calc): 151 mg/dL (calc) — ABNORMAL HIGH
Non-HDL Cholesterol (Calc): 188 mg/dL (calc) — ABNORMAL HIGH (ref <130)
Total CHOL/HDL Ratio: 4.5 (calc) (ref <5.0)
Triglycerides: 220 mg/dL — ABNORMAL HIGH (ref <150)

## 2021-03-08 LAB — CBC WITH DIFFERENTIAL/PLATELET
Absolute Monocytes: 415 cells/uL (ref 200–950)
Basophils Absolute: 31 cells/uL (ref 0–200)
Basophils Relative: 0.5 %
Eosinophils Absolute: 260 cells/uL (ref 15–500)
Eosinophils Relative: 4.2 %
HCT: 46.8 % (ref 38.5–50.0)
Hemoglobin: 15.6 g/dL (ref 13.2–17.1)
Lymphs Abs: 1990 cells/uL (ref 850–3900)
MCH: 30.3 pg (ref 27.0–33.0)
MCHC: 33.3 g/dL (ref 32.0–36.0)
MCV: 90.9 fL (ref 80.0–100.0)
MPV: 9.9 fL (ref 7.5–12.5)
Monocytes Relative: 6.7 %
Neutro Abs: 3503 cells/uL (ref 1500–7800)
Neutrophils Relative %: 56.5 %
Platelets: 287 10*3/uL (ref 140–400)
RBC: 5.15 10*6/uL (ref 4.20–5.80)
RDW: 12.3 % (ref 11.0–15.0)
Total Lymphocyte: 32.1 %
WBC: 6.2 10*3/uL (ref 3.8–10.8)

## 2021-03-08 LAB — COMPLETE METABOLIC PANEL WITH GFR
AG Ratio: 1.7 (calc) (ref 1.0–2.5)
ALT: 16 U/L (ref 9–46)
AST: 15 U/L (ref 10–35)
Albumin: 4.4 g/dL (ref 3.6–5.1)
Alkaline phosphatase (APISO): 44 U/L (ref 35–144)
BUN: 11 mg/dL (ref 7–25)
CO2: 30 mmol/L (ref 20–32)
Calcium: 9.4 mg/dL (ref 8.6–10.3)
Chloride: 101 mmol/L (ref 98–110)
Creat: 0.91 mg/dL (ref 0.70–1.30)
Globulin: 2.6 g/dL (calc) (ref 1.9–3.7)
Glucose, Bld: 156 mg/dL — ABNORMAL HIGH (ref 65–99)
Potassium: 4.3 mmol/L (ref 3.5–5.3)
Sodium: 139 mmol/L (ref 135–146)
Total Bilirubin: 0.3 mg/dL (ref 0.2–1.2)
Total Protein: 7 g/dL (ref 6.1–8.1)
eGFR: 101 mL/min/{1.73_m2} (ref >=60)

## 2021-03-08 LAB — PSA: PSA: 1.08 ng/mL (ref <=4.00)

## 2021-03-08 LAB — DM TEMPLATE

## 2021-03-08 LAB — TSH: TSH: 0.25 mIU/L — ABNORMAL LOW (ref 0.40–4.50)

## 2021-03-13 NOTE — Assessment & Plan Note (Signed)
Adding Advair daily.  Continue albuterol as needed.  Encouraged to quit smoking.

## 2021-03-13 NOTE — Assessment & Plan Note (Signed)
Blood pressure is mildly elevated however has been well controlled historically.  Recommend continuation of lisinopril at current strength.

## 2021-03-13 NOTE — Assessment & Plan Note (Signed)
Doing on current dose of Percocet.  We will continue current strength.  Updated UDS ordered today.

## 2021-03-13 NOTE — Assessment & Plan Note (Signed)
Feels good at current dose levothyroxine.  Update TSH today.

## 2021-03-13 NOTE — Progress Notes (Signed)
Reginald Salazar - 51 y.o. male MRN [id number]  Date of birth: [birth year]  Subjective Chief Complaint  Patient presents with   Follow-up    HPI Reginald Salazar is a 51 year old male here today for follow-up visit.  Reports overall he is doing well.  He has had some increased wheezing and cough. He is using albuterol more.  History of heavy smoking for several years.  No fever, chills or dyspnea.  Pain remains well managed with Percocet at current strength.  He has been on consistent dose for several months.  He denies side effects with current medication.    Blood pressure mains well controlled with lisinopril at current strength.  No side effects at this time.  Feels good correct dose of levothyroxine.  ROS:  A comprehensive ROS was completed and negative except as noted per HPI  No Known Allergies  Past Medical History:  Diagnosis Date   Hernia, abdominal 2008   Hyperlipidemia    Hypertension    Hypothyroidism    Sleep apnea    CPAP   Smoker    Thyroid disease     Past Surgical History:  Procedure Laterality Date   BACK SURGERY  2017    Social History   Socioeconomic History   Marital status: Married    Spouse name: Not on file   Number of children: Not on file   Years of education: Not on file   Highest education level: Not asked  Occupational History   Occupation: Retired  Tobacco Use   Smoking status: Every Day    Packs/day: 0.75    Years: 30.00    Pack years: 22.50    Types: Cigarettes   Smokeless tobacco: Never  Vaping Use   Vaping Use: Never used  Substance and Sexual Activity   Alcohol use: Yes    Alcohol/week: 12.0 standard drinks    Types: 12 Cans of beer per week   Drug use: Not Currently   Sexual activity: Yes    Partners: Female  Other Topics Concern   Not on file  Social History Narrative   Not on file   Social Determinants of Health   Financial Resource Strain: Not on file  Food Insecurity: Not on file  Transportation Needs: Not on  file  Physical Activity: Not on file  Stress: Not on file  Social Connections: Not on file    Family History  Problem Relation Age of Onset   Rheum arthritis Mother    Mitral valve prolapse Mother    Stomach cancer Maternal Grandmother    Colon cancer Neg Hx    Colon polyps Neg Hx    Esophageal cancer Neg Hx    Rectal cancer Neg Hx     Health Maintenance  Topic Date Due   HIV Screening  Never done   Hepatitis C Screening  Never done   COVID-19 Vaccine (3 - Pfizer risk series) 06/16/2019   INFLUENZA VACCINE  05/06/2021 (Originally 09/06/2020)   COLONOSCOPY (Pts 45-37yrs Insurance coverage will need to be confirmed)  11/19/2021   TETANUS/TDAP  03/21/2027   Zoster Vaccines- Shingrix  Completed   HPV VACCINES  Aged Out     ----------------------------------------------------------------------------------------------------------------------------------------------------------------------------------------------------------------- Physical Exam BP (!) 145/89 (BP Location: Right Arm, Patient Position: Sitting, Cuff Size: Normal)    Pulse 75    Ht 5\' 8"  (1.727 m)    Wt 215 lb (97.5 kg)    SpO2 96%    BMI 32.69 kg/m   Physical Exam Constitutional:  Appearance: Normal appearance.  Eyes:     General: No scleral icterus. Cardiovascular:     Rate and Rhythm: Normal rate and regular rhythm.  Pulmonary:     Effort: Pulmonary effort is normal.     Breath sounds: Normal breath sounds.  Musculoskeletal:     Cervical back: Neck supple.  Neurological:     General: No focal deficit present.     Mental Status: He is alert.  Psychiatric:        Mood and Affect: Mood normal.        Behavior: Behavior normal.    ------------------------------------------------------------------------------------------------------------------------------------------------------------------------------------------------------------------- Assessment and Plan  Hypertension Blood pressure is mildly  elevated however has been well controlled historically.  Recommend continuation of lisinopril at current strength.  Hypothyroidism Feels good at current dose levothyroxine.  Update TSH today.  Lumbar spondylosis Doing on current dose of Percocet.  We will continue current strength.  Updated UDS ordered today.  Chronic bronchitis (HCC) Adding Advair daily.  Continue albuterol as needed.  Encouraged to quit smoking.   Meds ordered this encounter  Medications   lisinopril (ZESTRIL) 20 MG tablet    Sig: Take 1 tablet (20 mg total) by mouth daily.    Dispense:  90 tablet    Refill:  2   SYNTHROID 175 MCG tablet    Sig: Take #1/2 tablet on Saturday and #1 tablet Sunday-Friday.    Dispense:  90 tablet    Refill:  2    Please dispense brand name of Synthroid . Levothyroxine causes hair loss.   fluticasone-salmeterol (ADVAIR DISKUS) 250-50 MCG/ACT AEPB    Sig: Inhale 1 puff into the lungs in the morning and at bedtime.    Dispense:  60 each    Refill:  3    No follow-ups on file.    This visit occurred during the SARS-CoV-2 public health emergency.  Safety protocols were in place, including screening questions prior to the visit, additional usage of staff PPE, and extensive cleaning of exam room while observing appropriate contact time as indicated for disinfecting solutions.

## 2021-03-18 ENCOUNTER — Encounter: Payer: Self-pay | Admitting: Family Medicine

## 2021-03-21 ENCOUNTER — Other Ambulatory Visit: Payer: Self-pay | Admitting: Family Medicine

## 2021-03-23 ENCOUNTER — Encounter: Payer: Self-pay | Admitting: Family Medicine

## 2021-03-24 ENCOUNTER — Other Ambulatory Visit: Payer: Self-pay

## 2021-03-24 ENCOUNTER — Ambulatory Visit (INDEPENDENT_AMBULATORY_CARE_PROVIDER_SITE_OTHER): Admitting: Family Medicine

## 2021-03-24 ENCOUNTER — Encounter: Payer: Self-pay | Admitting: Family Medicine

## 2021-03-24 VITALS — BP 120/61 | HR 85 | Resp 18 | Ht 68.0 in | Wt 216.0 lb

## 2021-03-24 DIAGNOSIS — J302 Other seasonal allergic rhinitis: Secondary | ICD-10-CM

## 2021-03-24 DIAGNOSIS — J029 Acute pharyngitis, unspecified: Secondary | ICD-10-CM

## 2021-03-24 NOTE — Progress Notes (Signed)
Acute Office Visit  Subjective:    Patient ID: Reginald Salazar, male    DOB: 1970-09-26, 51 y.o.   MRN: [id number]  Chief Complaint  Patient presents with   Throat irritation    Since starting on Advair 1 week ago. Patient states he stopped on Advair on Monday 03/21/21.     HPI Patient is in today for throat irritation.  Started after starting Advair a couple of weeks ago and then about 6 to 7 days ago he started getting a sore throat he describes it as feeling like something was stuck in the back of his throat most like a form.  He says it was painful every time he swallowed.  Always worse first thing in the morning and in the evenings.  But he had also traveled [place] of here around that time and was in [place] when it started so he was not sure if there might be an allergic component as well.  He does have seasonal allergies.  He decided to stop the Advair on Monday because he read it could cause a sore throat and his throat does feel a little better today.  He does have some nasal congestion today as well.      Past Medical History:  Diagnosis Date   Hernia, abdominal 2008   Hyperlipidemia    Hypertension    Hypothyroidism    Sleep apnea    CPAP   Smoker    Thyroid disease     Past Surgical History:  Procedure Laterality Date   BACK SURGERY  2017    Family History  Problem Relation Age of Onset   Rheum arthritis Mother    Mitral valve prolapse Mother    Stomach cancer Maternal Grandmother    Colon cancer Neg Hx    Colon polyps Neg Hx    Esophageal cancer Neg Hx    Rectal cancer Neg Hx     Social History   Socioeconomic History   Marital status: Married    Spouse name: Not on file   Number of children: Not on file   Years of education: Not on file   Highest education level: Not asked  Occupational History   Occupation: Retired  Tobacco Use   Smoking status: Every Day    Packs/day: 0.75    Years: 30.00    Pack years: 22.50    Types: Cigarettes    Smokeless tobacco: Never  Vaping Use   Vaping Use: Never used  Substance and Sexual Activity   Alcohol use: Yes    Alcohol/week: 12.0 standard drinks    Types: 12 Cans of beer per week   Drug use: Not Currently   Sexual activity: Yes    Partners: Female  Other Topics Concern   Not on file  Social History Narrative   Not on file   Social Determinants of Health   Financial Resource Strain: Not on file  Food Insecurity: Not on file  Transportation Needs: Not on file  Physical Activity: Not on file  Stress: Not on file  Social Connections: Not on file  Intimate Partner Violence: Not on file    Outpatient Medications Prior to Visit  Medication Sig Dispense Refill   albuterol (VENTOLIN HFA) 108 (90 Base) MCG/ACT inhaler INHALE 2 PUFFS INTO THE LUNGS EVERY 6 HOURS AS NEEDED FOR WHEEZING OR SHORTNESS OF BREATH 6.7 g 3   diclofenac sodium (VOLTAREN) 1 % GEL Apply 2 g topically 4 (four) times daily. 150 g 1  lisinopril (ZESTRIL) 20 MG tablet Take 1 tablet (20 mg total) by mouth daily. 90 tablet 2   Multiple Vitamin (MULTIVITAMIN) tablet Take 1 tablet by mouth daily.     oxyCODONE-acetaminophen (PERCOCET/ROXICET) 5-325 MG tablet Take 1 tablet by mouth every 6 (six) hours as needed for severe pain. 120 tablet 0   SYNTHROID 175 MCG tablet Take #1/2 tablet on Saturday and #1 tablet Sunday-Friday. 90 tablet 2   fluticasone-salmeterol (ADVAIR DISKUS) 250-50 MCG/ACT AEPB Inhale 1 puff into the lungs in the morning and at bedtime. (Patient not taking: Reported on 03/24/2021) 60 each 3   No facility-administered medications prior to visit.    No Known Allergies  Review of Systems     Objective:    Physical Exam Constitutional:      Appearance: He is well-developed.  HENT:     Head: Normocephalic and atraumatic.     Right Ear: External ear normal.     Left Ear: External ear normal.     Nose: Nose normal.  Eyes:     Conjunctiva/sclera: Conjunctivae normal.     Pupils: Pupils are  equal, round, and reactive to light.  Neck:     Thyroid: No thyromegaly.  Cardiovascular:     Rate and Rhythm: Normal rate.     Heart sounds: Normal heart sounds.  Pulmonary:     Effort: Pulmonary effort is normal.     Breath sounds: Normal breath sounds.  Musculoskeletal:     Cervical back: Neck supple.  Lymphadenopathy:     Cervical: No cervical adenopathy.  Skin:    General: Skin is warm and dry.  Neurological:     Mental Status: He is alert and oriented to person, place, and time.    BP 120/61    Pulse 85    Resp 18    Ht _0  (1.727 m)    Wt 216 lb (98 kg)    SpO2 97%    BMI 32.84 kg/m  Wt Readings from Last 3 Encounters:  03/24/21 216 lb (98 kg)  03/07/21 215 lb (97.5 kg)  10/06/20 211 lb 8 oz (95.9 kg)    Health Maintenance Due  Topic Date Due   HIV Screening  Never done   Hepatitis C Screening  Never done   COVID-19 Vaccine (3 - Pfizer risk series) 06/16/2019    There are no preventive care reminders to display for this patient.   Lab Results  Component Value Date   TSH 0.25 (L) 03/07/2021   Lab Results  Component Value Date   WBC 6.2 03/07/2021   HGB 15.6 03/07/2021   HCT 46.8 03/07/2021   MCV 90.9 03/07/2021   PLT 287 03/07/2021   Lab Results  Component Value Date   NA 139 03/07/2021   K 4.3 03/07/2021   CO2 30 03/07/2021   GLUCOSE 156 (H) 03/07/2021   BUN 11 03/07/2021   CREATININE 0.91 03/07/2021   BILITOT 0.3 03/07/2021   AST 15 03/07/2021   ALT 16 03/07/2021   PROT 7.0 03/07/2021   CALCIUM 9.4 03/07/2021   EGFR 101 03/07/2021   Lab Results  Component Value Date   CHOL 242 (H) 03/07/2021   Lab Results  Component Value Date   HDL 54 03/07/2021   Lab Results  Component Value Date   LDLCALC 151 (H) 03/07/2021   Lab Results  Component Value Date   TRIG 220 (H) 03/07/2021   Lab Results  Component Value Date   CHOLHDL 4.5 03/07/2021  No results found for: HGBA1C     Assessment & Plan:   Problem List Items Addressed This  Visit   None Visit Diagnoses     Sore throat    -  Primary   Seasonal allergies          Sore throat-we discussed options.  He is feeling a little better today so it certainly possible that it was the Advair being that he is now starting to feel little better.  I did not see any evidence of strep throat or thrush on exam and he says he was rinsing consistently after using the Advair.  We discussed maybe giving it a couple more days to see if he continues to improve and then considering a retrial of the Advair.  If the throat irritation comes back then we will stop it and he can send me a note and will send in generic Symbicort instead.  If he notices any new changes swelling, redness or just feeling worse in general then please let us know.  Seasonal allergies-did encourage him to go ahead and restart his nasal fluticasone.  No orders of the defined types were placed in this encounter.    Beatrice Lecher, MD

## 2021-03-24 NOTE — Patient Instructions (Signed)
Call is sore throat gets worse once you restart the Advair.

## 2021-04-04 ENCOUNTER — Encounter: Payer: Self-pay | Admitting: Family Medicine

## 2021-04-04 MED ORDER — OXYCODONE-ACETAMINOPHEN 5-325 MG PO TABS: 1.0000 | ORAL_TABLET | Freq: Four times a day (QID) | ORAL | 0 refills | Status: DC | PRN

## 2021-04-04 NOTE — Telephone Encounter (Signed)
Medication renewed.   Thanks!

## 2021-04-20 IMAGING — DX DG CERVICAL SPINE COMPLETE 4+V
5 series · 5 of 5 positions shown · non-contrast
Comparison: None.

CLINICAL DATA: Neck pain

EXAM:
CERVICAL SPINE - COMPLETE 4+ VIEW

[cervical spine ap]
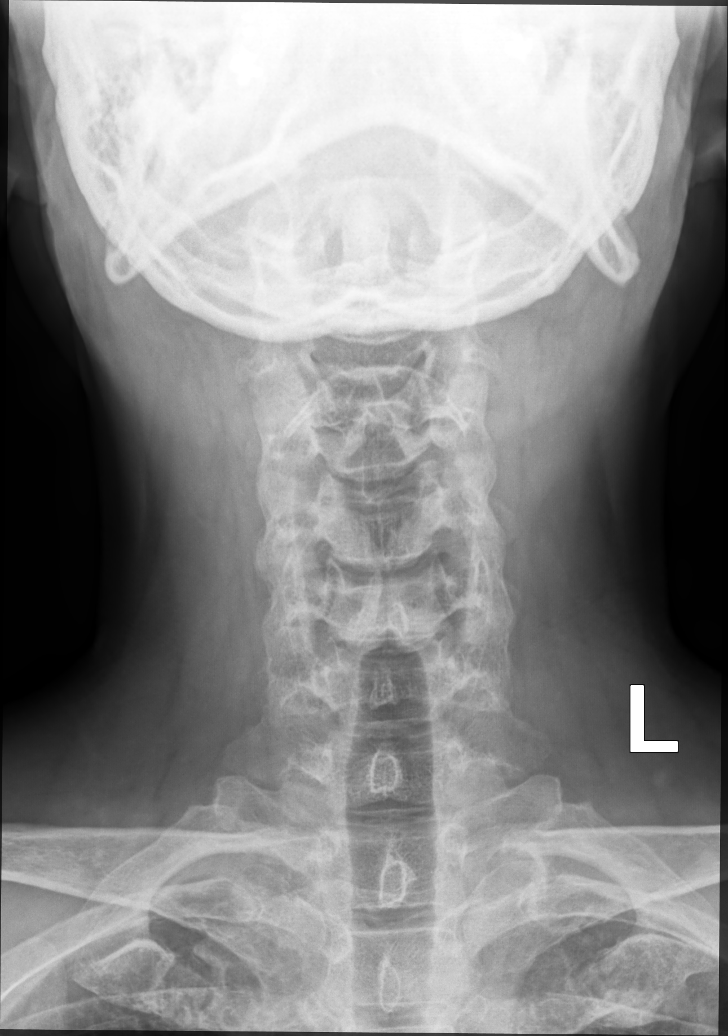

[cervical spine oblique (1 of 2)]
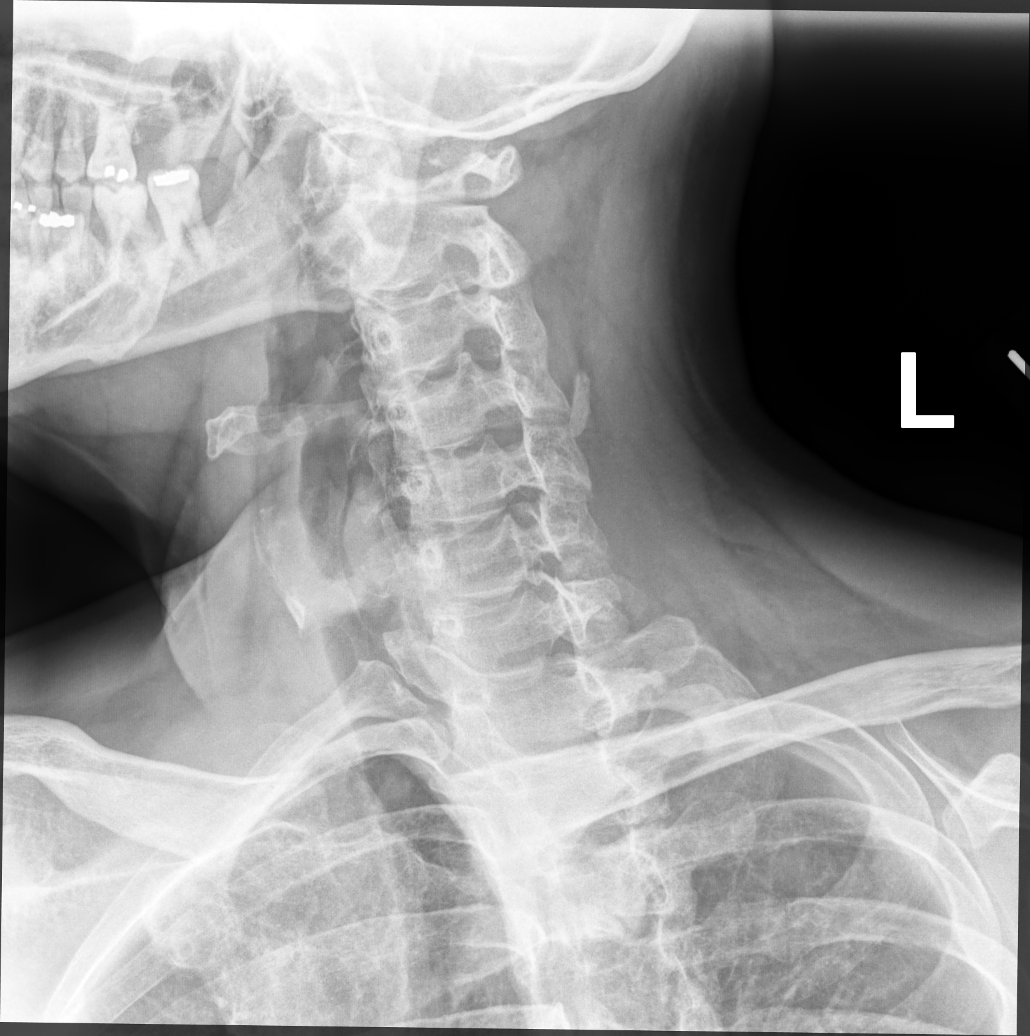

[cervical spine oblique (2 of 2)]
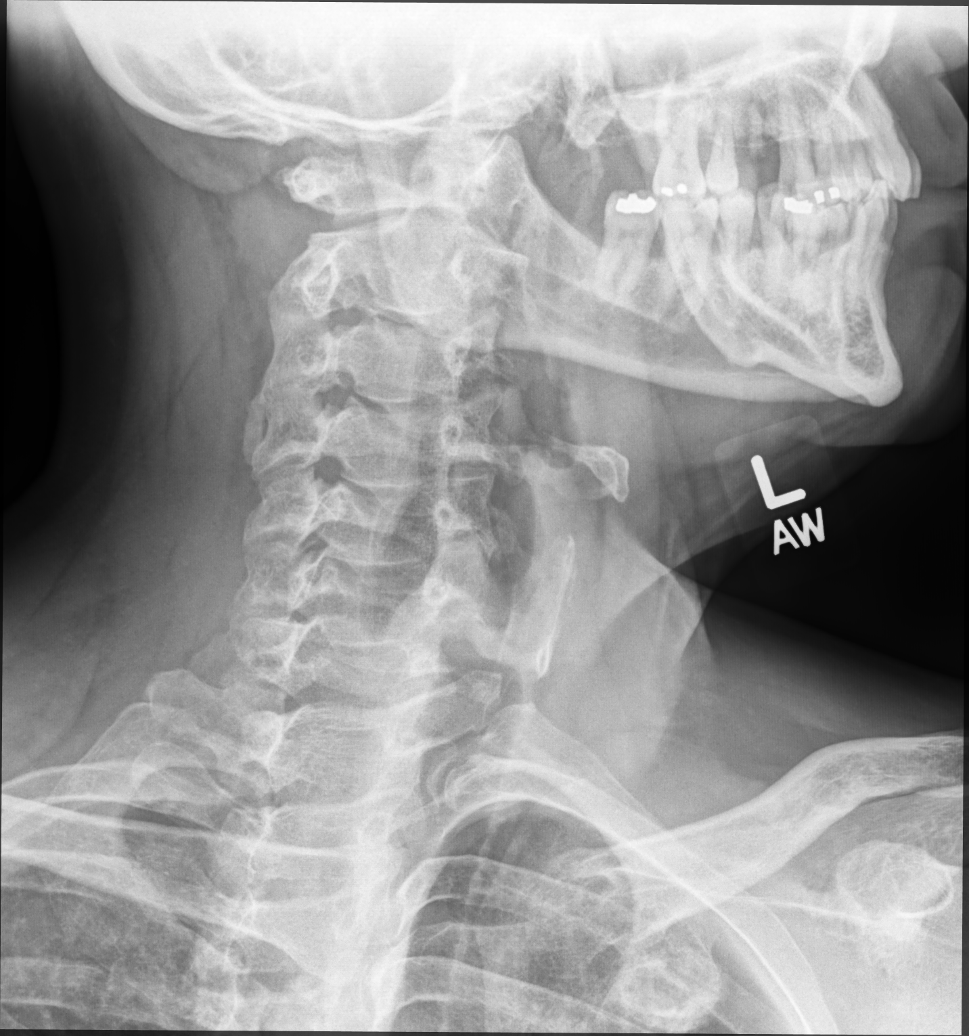

[cervical spine lat]
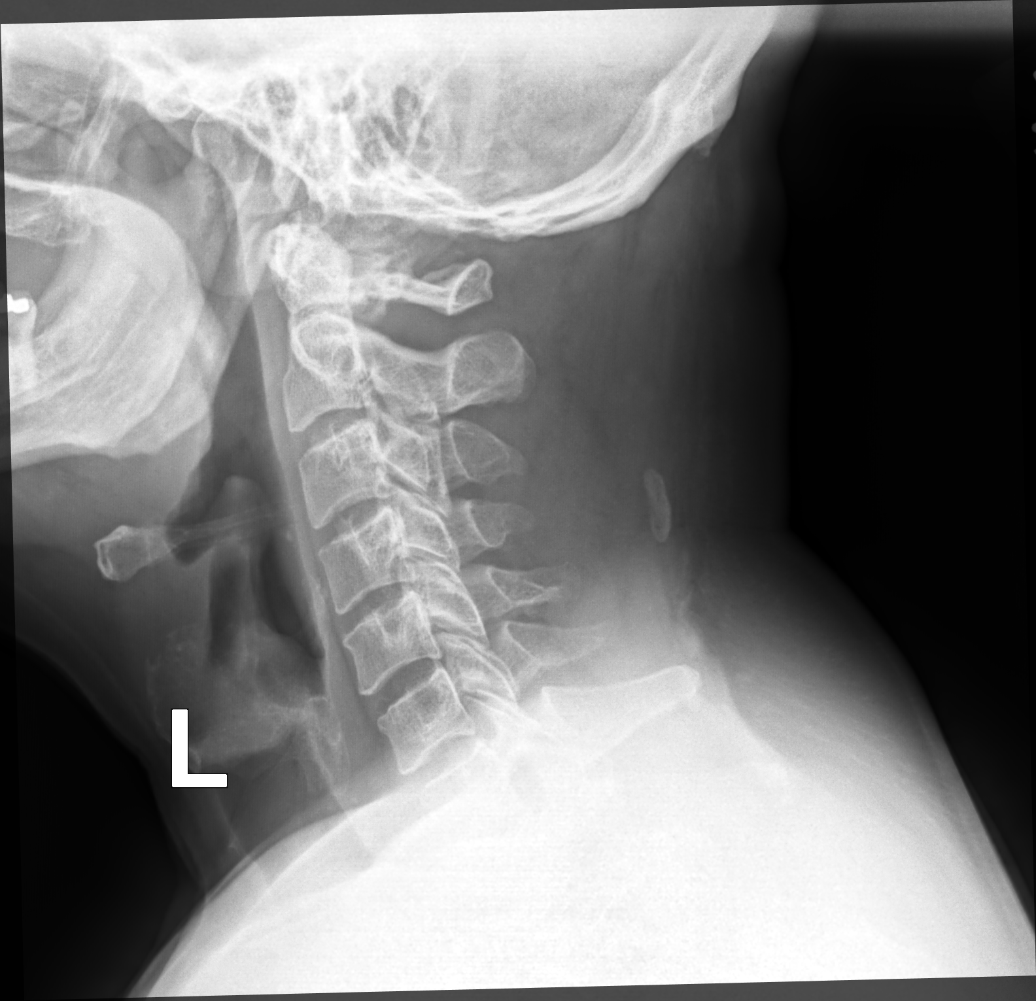

[cervical spine open mouth ap]
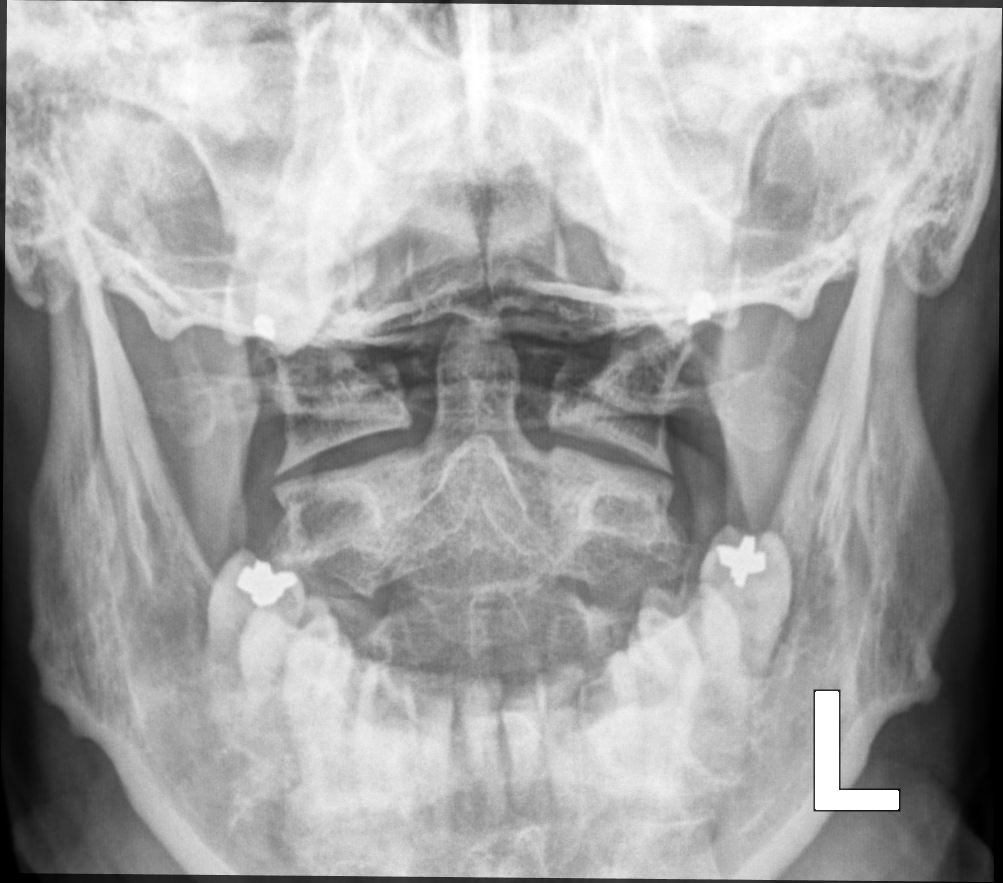

[5 of 5 positions shown; findings below may reference images not displayed]

FINDINGS: Normal cervical alignment. Negative for fracture or mass. Disc
spaces maintained. No significant disc degeneration or spurring.
Neural foramina patent bilaterally. Prevertebral soft tissues
normal.
IMPRESSION: Negative

## 2021-04-28 ENCOUNTER — Other Ambulatory Visit: Payer: Self-pay

## 2021-04-28 DIAGNOSIS — E063 Autoimmune thyroiditis: Secondary | ICD-10-CM

## 2021-04-28 MED ORDER — SYNTHROID 175 MCG PO TABS: ORAL_TABLET | ORAL | 2 refills | Status: DC

## 2021-05-04 ENCOUNTER — Other Ambulatory Visit: Payer: Self-pay | Admitting: Family Medicine

## 2021-05-05 MED ORDER — OXYCODONE-ACETAMINOPHEN 5-325 MG PO TABS: 1.0000 | ORAL_TABLET | Freq: Four times a day (QID) | ORAL | 0 refills | Status: DC | PRN

## 2021-05-31 ENCOUNTER — Other Ambulatory Visit: Payer: Self-pay | Admitting: Family Medicine

## 2021-07-06 ENCOUNTER — Encounter: Payer: Self-pay | Admitting: Family Medicine

## 2021-07-07 MED ORDER — OXYCODONE-ACETAMINOPHEN 5-325 MG PO TABS: 1.0000 | ORAL_TABLET | Freq: Four times a day (QID) | ORAL | 0 refills | Status: DC | PRN

## 2021-07-07 NOTE — Telephone Encounter (Signed)
It appears that pt was taking oxycodone, but it is no longer listed on the pt's current medication list.  Please review and refill if appropriate.  Charyl Bigger, CMA

## 2021-07-17 ENCOUNTER — Other Ambulatory Visit: Payer: Self-pay | Admitting: Family Medicine

## 2021-08-08 ENCOUNTER — Other Ambulatory Visit: Payer: Self-pay | Admitting: Family Medicine

## 2021-08-09 ENCOUNTER — Encounter: Payer: Self-pay | Admitting: Family Medicine

## 2021-08-10 ENCOUNTER — Telehealth: Payer: Self-pay | Admitting: Family Medicine

## 2021-08-10 MED ORDER — OXYCODONE-ACETAMINOPHEN 5-325 MG PO TABS: 1.0000 | ORAL_TABLET | Freq: Four times a day (QID) | ORAL | 0 refills | Status: DC | PRN

## 2021-08-10 NOTE — Telephone Encounter (Signed)
New rx sent

## 2021-08-10 NOTE — Telephone Encounter (Signed)
Pt called.  He needs a refill on his percocet. He has an appointment scheduled on August 3rd.

## 2021-08-30 ENCOUNTER — Ambulatory Visit: Admitting: Family Medicine

## 2021-09-08 ENCOUNTER — Ambulatory Visit: Admitting: Family Medicine

## 2021-09-09 ENCOUNTER — Ambulatory Visit: Admitting: Family Medicine

## 2021-09-12 ENCOUNTER — Other Ambulatory Visit: Payer: Self-pay | Admitting: Family Medicine

## 2021-09-12 MED ORDER — OXYCODONE-ACETAMINOPHEN 5-325 MG PO TABS: 1.0000 | ORAL_TABLET | Freq: Four times a day (QID) | ORAL | 0 refills | Status: DC | PRN

## 2021-09-12 NOTE — Telephone Encounter (Signed)
Last OV: 03/24/21 Next OV: 09/20/21.  Last RF: 08/10/21

## 2021-09-20 ENCOUNTER — Encounter: Payer: Self-pay | Admitting: Family Medicine

## 2021-09-20 ENCOUNTER — Ambulatory Visit (INDEPENDENT_AMBULATORY_CARE_PROVIDER_SITE_OTHER): Admitting: Family Medicine

## 2021-09-20 VITALS — BP 126/79 | HR 74 | Ht 68.0 in | Wt 233.0 lb

## 2021-09-20 DIAGNOSIS — E038 Other specified hypothyroidism: Secondary | ICD-10-CM | POA: Diagnosis not present

## 2021-09-20 DIAGNOSIS — R5383 Other fatigue: Secondary | ICD-10-CM

## 2021-09-20 DIAGNOSIS — I10 Essential (primary) hypertension: Secondary | ICD-10-CM

## 2021-09-20 DIAGNOSIS — M47816 Spondylosis without myelopathy or radiculopathy, lumbar region: Secondary | ICD-10-CM

## 2021-09-20 DIAGNOSIS — E063 Autoimmune thyroiditis: Secondary | ICD-10-CM

## 2021-09-20 DIAGNOSIS — R635 Abnormal weight gain: Secondary | ICD-10-CM | POA: Diagnosis not present

## 2021-09-20 MED ORDER — LISINOPRIL 20 MG PO TABS: 20.0000 mg | ORAL_TABLET | Freq: Every day | ORAL | 2 refills | Status: DC

## 2021-09-20 MED ORDER — SYNTHROID 175 MCG PO TABS: ORAL_TABLET | ORAL | 2 refills | Status: DC

## 2021-09-20 MED ORDER — CHANTIX STARTING MONTH PAK 0.5 MG X 11 & 1 MG X 42 PO TBPK: ORAL_TABLET | ORAL | 0 refills | Status: DC

## 2021-09-20 MED ORDER — VARENICLINE TARTRATE 1 MG PO TABS: 1.0000 mg | ORAL_TABLET | Freq: Two times a day (BID) | ORAL | 2 refills | Status: DC

## 2021-09-20 NOTE — Progress Notes (Signed)
Reginald Salazar - 51 y.o. male MRN [id number]  Date of birth: [birth year]  Subjective Chief Complaint  Patient presents with   Medication Refill    HPI Reginald Salazar is a 51 year old male here today for follow-up visit.  Continues on chronic opioid therapy for history of chronic low back pain.  This was managed by his neurosurgeon previously however he retired.  There is no stable opioid dose for several years.  Pain is well controlled.  He denies any side effects related to this.    Feels pretty good with current dose of levothyroxine.  He has noted some weight gain recently.  Denies any changes to his diet or activity level.  Also some thinning of his fingernails.  Blood pressures remain well controlled with lisinopril 20 mg daily.  Denies side effects from medication.  Has not had chest pain, shortness of breath, palpitations, headaches or vision changes.  He does continue to smoke but is interested in trying quitting.  Has never tried Chantix will be interested in trying this.  ROS:  A comprehensive ROS was completed and negative except as noted per HPI    No Known Allergies  Past Medical History:  Diagnosis Date   Hernia, abdominal 2008   Hyperlipidemia    Hypertension    Hypothyroidism    Sleep apnea    CPAP   Smoker    Thyroid disease     Past Surgical History:  Procedure Laterality Date   BACK SURGERY  2017    Social History   Socioeconomic History   Marital status: Married    Spouse name: Not on file   Number of children: Not on file   Years of education: Not on file   Highest education level: Not asked  Occupational History   Occupation: Retired  Tobacco Use   Smoking status: Every Day    Packs/day: 0.75    Years: 30.00    Total pack years: 22.50    Types: Cigarettes   Smokeless tobacco: Never  Vaping Use   Vaping Use: Never used  Substance and Sexual Activity   Alcohol use: Yes    Alcohol/week: 12.0 standard drinks of alcohol    Types: 12  Cans of beer per week   Drug use: Not Currently   Sexual activity: Yes    Partners: Female  Other Topics Concern   Not on file  Social History Narrative   Not on file   Social Determinants of Health   Financial Resource Strain: Not on file  Food Insecurity: Not on file  Transportation Needs: Not on file  Physical Activity: Not on file  Stress: Not on file  Social Connections: Not on file    Family History  Problem Relation Age of Onset   Rheum arthritis Mother    Mitral valve prolapse Mother    Stomach cancer Maternal Grandmother    Colon cancer Neg Hx    Colon polyps Neg Hx    Esophageal cancer Neg Hx    Rectal cancer Neg Hx     Health Maintenance  Topic Date Due   COVID-19 Vaccine (3 - Pfizer series) 02/06/2022 (Originally 07/14/2019)   INFLUENZA VACCINE  05/07/2022 (Originally 09/06/2021)   Hepatitis C Screening  09/21/2022 (Originally 08/02/1986)   HIV Screening  09/21/2022 (Originally 08/02/1983)   COLONOSCOPY (Pts 45-95yr Insurance coverage will need to be confirmed)  11/19/2021   TETANUS/TDAP  03/21/2027   Zoster Vaccines- Shingrix  Completed   HPV VACCINES  Aged Out     -----------------------------------------------------------------------------------------------------------------------------------------------------------------------------------------------------------------  Physical Exam BP 126/79 (BP Location: Left Arm, Patient Position: Sitting, Cuff Size: Large)   Pulse 74   Ht '5\' 8"'$  (1.727 m)   Wt 233 lb (105.7 kg)   SpO2 97%   BMI 35.43 kg/m   Physical Exam Constitutional:      Appearance: Normal appearance.  Eyes:     General: No scleral icterus. Cardiovascular:     Rate and Rhythm: Normal rate and regular rhythm.  Pulmonary:     Effort: Pulmonary effort is normal.     Breath sounds: Normal breath sounds.  Musculoskeletal:     Cervical back: Neck supple.  Neurological:     Mental Status: He is alert.  Psychiatric:        Mood and Affect:  Mood normal.        Behavior: Behavior normal.      ------------------------------------------------------------------------------------------------------------------------------------------------------------------------------------------------------------------- Assessment and Plan  Hypertension Continue lisinopril at current strength.  Counseled on smoking cessation as well.  Hypothyroidism He has noted some weight gain.  Taking levothyroxine daily on an empty stomach.  Update TSH today.  Lumbar spondylosis He has remained on stable dose of oxycodone/acetaminophen.  Pain contract signed UDS completed at previous visit, we will plan to repeat at next visit.  PDMP reviewed.   Meds ordered this encounter  Medications   SYNTHROID 175 MCG tablet    Sig: Take #1/2 tablet on Saturday and #1 tablet Sunday-Friday.    Dispense:  90 tablet    Refill:  2    Please dispense brand name of Synthroid . Levothyroxine causes hair loss.   lisinopril (ZESTRIL) 20 MG tablet    Sig: Take 1 tablet (20 mg total) by mouth daily.    Dispense:  90 tablet    Refill:  2   Varenicline Tartrate, Starter, (CHANTIX STARTING MONTH PAK) 0.5 MG X 11 & 1 MG X 42 TBPK    Sig: Take as directed on packaging    Dispense:  53 each    Refill:  0   varenicline (CHANTIX CONTINUING MONTH PAK) 1 MG tablet    Sig: Take 1 tablet (1 mg total) by mouth 2 (two) times daily.    Dispense:  60 tablet    Refill:  2    No follow-ups on file.    This visit occurred during the SARS-CoV-2 public health emergency.  Safety protocols were in place, including screening questions prior to the visit, additional usage of staff PPE, and extensive cleaning of exam room while observing appropriate contact time as indicated for disinfecting solutions.

## 2021-09-20 NOTE — Assessment & Plan Note (Signed)
Continue lisinopril at current strength.  Counseled on smoking cessation as well.

## 2021-09-20 NOTE — Assessment & Plan Note (Signed)
He has remained on stable dose of oxycodone/acetaminophen.  Pain contract signed UDS completed at previous visit, we will plan to repeat at next visit.  PDMP reviewed.

## 2021-09-20 NOTE — Assessment & Plan Note (Signed)
He has noted some weight gain.  Taking levothyroxine daily on an empty stomach.  Update TSH today.

## 2021-09-21 LAB — COMPLETE METABOLIC PANEL WITH GFR
AG Ratio: 1.7 (calc) (ref 1.0–2.5)
ALT: 15 U/L (ref 9–46)
AST: 15 U/L (ref 10–35)
Albumin: 4.5 g/dL (ref 3.6–5.1)
Alkaline phosphatase (APISO): 50 U/L (ref 35–144)
BUN: 11 mg/dL (ref 7–25)
CO2: 28 mmol/L (ref 20–32)
Calcium: 9.5 mg/dL (ref 8.6–10.3)
Chloride: 101 mmol/L (ref 98–110)
Creat: 0.97 mg/dL (ref 0.70–1.30)
Globulin: 2.7 g/dL (calc) (ref 1.9–3.7)
Glucose, Bld: 110 mg/dL — ABNORMAL HIGH (ref 65–99)
Potassium: 4.7 mmol/L (ref 3.5–5.3)
Sodium: 137 mmol/L (ref 135–146)
Total Bilirubin: 0.4 mg/dL (ref 0.2–1.2)
Total Protein: 7.2 g/dL (ref 6.1–8.1)
eGFR: 93 mL/min/{1.73_m2} (ref >=60)

## 2021-09-21 LAB — CBC WITH DIFFERENTIAL/PLATELET
Absolute Monocytes: 360 cells/uL (ref 200–950)
Basophils Absolute: 40 cells/uL (ref 0–200)
Basophils Relative: 0.8 %
Eosinophils Absolute: 230 cells/uL (ref 15–500)
Eosinophils Relative: 4.6 %
HCT: 46.7 % (ref 38.5–50.0)
Hemoglobin: 15.9 g/dL (ref 13.2–17.1)
Lymphs Abs: 2050 cells/uL (ref 850–3900)
MCH: 31.2 pg (ref 27.0–33.0)
MCHC: 34 g/dL (ref 32.0–36.0)
MCV: 91.6 fL (ref 80.0–100.0)
MPV: 9.7 fL (ref 7.5–12.5)
Monocytes Relative: 7.2 %
Neutro Abs: 2320 cells/uL (ref 1500–7800)
Neutrophils Relative %: 46.4 %
Platelets: 238 10*3/uL (ref 140–400)
RBC: 5.1 10*6/uL (ref 4.20–5.80)
RDW: 12.9 % (ref 11.0–15.0)
Total Lymphocyte: 41 %
WBC: 5 10*3/uL (ref 3.8–10.8)

## 2021-09-21 LAB — TESTOSTERONE: Testosterone: 253 ng/dL (ref 250–827)

## 2021-09-21 LAB — TSH+FREE T4: TSH W/REFLEX TO FT4: 5.38 mIU/L — ABNORMAL HIGH (ref 0.40–4.50)

## 2021-09-21 LAB — T4, FREE: Free T4: 1.4 ng/dL (ref 0.8–1.8)

## 2021-09-23 ENCOUNTER — Other Ambulatory Visit: Payer: Self-pay | Admitting: Family Medicine

## 2021-09-23 ENCOUNTER — Encounter: Payer: Self-pay | Admitting: Family Medicine

## 2021-09-23 DIAGNOSIS — E038 Other specified hypothyroidism: Secondary | ICD-10-CM

## 2021-09-26 ENCOUNTER — Other Ambulatory Visit: Payer: Self-pay | Admitting: Family Medicine

## 2021-09-26 MED ORDER — SYNTHROID 200 MCG PO TABS: 200.0000 ug | ORAL_TABLET | Freq: Every day | ORAL | 3 refills | Status: DC

## 2021-10-06 ENCOUNTER — Other Ambulatory Visit: Payer: Self-pay

## 2021-10-06 MED ORDER — CHANTIX STARTING MONTH PAK 0.5 MG X 11 & 1 MG X 42 PO TBPK: ORAL_TABLET | ORAL | 0 refills | Status: DC

## 2021-10-06 MED ORDER — VARENICLINE TARTRATE 1 MG PO TABS: 1.0000 mg | ORAL_TABLET | Freq: Two times a day (BID) | ORAL | 2 refills | Status: DC

## 2021-10-11 ENCOUNTER — Other Ambulatory Visit: Payer: Self-pay | Admitting: Sports Medicine

## 2021-10-11 ENCOUNTER — Other Ambulatory Visit: Payer: Self-pay | Admitting: Family Medicine

## 2021-10-19 ENCOUNTER — Other Ambulatory Visit: Payer: Self-pay | Admitting: Family Medicine

## 2021-10-20 NOTE — Telephone Encounter (Signed)
Last OV 09/20/2021.  Last RX was 09/12/2021.  Charyl Bigger, CMA

## 2021-10-21 MED ORDER — OXYCODONE-ACETAMINOPHEN 5-325 MG PO TABS: 1.0000 | ORAL_TABLET | Freq: Four times a day (QID) | ORAL | 0 refills | Status: DC | PRN

## 2021-10-27 ENCOUNTER — Other Ambulatory Visit: Payer: Self-pay | Admitting: Family Medicine

## 2021-11-10 ENCOUNTER — Other Ambulatory Visit: Payer: Self-pay

## 2021-11-10 MED ORDER — FLUTICASONE-SALMETEROL 250-50 MCG/ACT IN AEPB: 1.0000 | INHALATION_SPRAY | Freq: Two times a day (BID) | RESPIRATORY_TRACT | 1 refills | Status: DC

## 2021-11-22 ENCOUNTER — Encounter: Payer: Self-pay | Admitting: Family Medicine

## 2021-11-22 ENCOUNTER — Other Ambulatory Visit: Payer: Self-pay | Admitting: Family Medicine

## 2021-11-23 MED ORDER — OXYCODONE-ACETAMINOPHEN 5-325 MG PO TABS: 1.0000 | ORAL_TABLET | Freq: Four times a day (QID) | ORAL | 0 refills | Status: DC | PRN

## 2021-11-30 ENCOUNTER — Encounter: Payer: Self-pay | Admitting: Family Medicine

## 2021-11-30 LAB — TSH: TSH: 0.33 mIU/L — ABNORMAL LOW (ref 0.40–4.50)

## 2021-12-12 ENCOUNTER — Encounter: Payer: Self-pay | Admitting: Family Medicine

## 2021-12-13 MED ORDER — SYNTHROID 200 MCG PO TABS: 200.0000 ug | ORAL_TABLET | Freq: Every day | ORAL | 3 refills | Status: DC

## 2021-12-26 ENCOUNTER — Other Ambulatory Visit: Payer: Self-pay | Admitting: Family Medicine

## 2021-12-27 ENCOUNTER — Other Ambulatory Visit: Payer: Self-pay | Admitting: Family Medicine

## 2021-12-27 MED ORDER — OXYCODONE-ACETAMINOPHEN 5-325 MG PO TABS: 1.0000 | ORAL_TABLET | Freq: Four times a day (QID) | ORAL | 0 refills | Status: DC | PRN

## 2022-01-09 ENCOUNTER — Ambulatory Visit: Admitting: Family Medicine

## 2022-01-09 ENCOUNTER — Encounter: Payer: Self-pay | Admitting: Family Medicine

## 2022-01-09 VITALS — BP 143/83 | HR 93 | Ht 68.0 in | Wt 233.0 lb

## 2022-01-09 DIAGNOSIS — E038 Other specified hypothyroidism: Secondary | ICD-10-CM | POA: Diagnosis not present

## 2022-01-09 DIAGNOSIS — F17200 Nicotine dependence, unspecified, uncomplicated: Secondary | ICD-10-CM | POA: Diagnosis not present

## 2022-01-09 DIAGNOSIS — Z9189 Other specified personal risk factors, not elsewhere classified: Secondary | ICD-10-CM | POA: Insufficient documentation

## 2022-01-09 DIAGNOSIS — M47816 Spondylosis without myelopathy or radiculopathy, lumbar region: Secondary | ICD-10-CM

## 2022-01-09 DIAGNOSIS — F17210 Nicotine dependence, cigarettes, uncomplicated: Secondary | ICD-10-CM | POA: Diagnosis not present

## 2022-01-09 DIAGNOSIS — M5430 Sciatica, unspecified side: Secondary | ICD-10-CM

## 2022-01-09 DIAGNOSIS — R5383 Other fatigue: Secondary | ICD-10-CM | POA: Diagnosis not present

## 2022-01-09 DIAGNOSIS — E782 Mixed hyperlipidemia: Secondary | ICD-10-CM

## 2022-01-09 DIAGNOSIS — I10 Essential (primary) hypertension: Secondary | ICD-10-CM

## 2022-01-09 DIAGNOSIS — E063 Autoimmune thyroiditis: Secondary | ICD-10-CM

## 2022-01-09 MED ORDER — NICOTINE 7 MG/24HR TD PT24: 7.0000 mg | MEDICATED_PATCH | Freq: Every day | TRANSDERMAL | 0 refills | Status: DC

## 2022-01-09 MED ORDER — NICOTINE 14 MG/24HR TD PT24: 14.0000 mg | MEDICATED_PATCH | Freq: Every day | TRANSDERMAL | 0 refills | Status: DC

## 2022-01-09 MED ORDER — BUPROPION HCL ER (XL) 150 MG PO TB24: 150.0000 mg | ORAL_TABLET | Freq: Every day | ORAL | 0 refills | Status: DC

## 2022-01-09 MED ORDER — NICOTINE 21 MG/24HR TD PT24: 21.0000 mg | MEDICATED_PATCH | Freq: Every day | TRANSDERMAL | 0 refills | Status: DC

## 2022-01-09 NOTE — Progress Notes (Signed)
Reginald Salazar - 51 y.o. male MRN [id number]  Date of birth: Apr 23, 1970  Subjective Chief Complaint  Patient presents with   Fatigue   Nicotine Dependence    HPI Reginald Salazar is a 51 y.o. male here today for follow up visit.   He is interested in having lung cancer screening.  History of approximately 20 to 25 pack years of smoking.  He has been unable to quit and stay quit.  We did try Chantix however he did not tolerate this well.  He would be interested in trying bupropion and patches.  He was able to quit with this for short period of time several years ago.  He has had some increased fatigue.  Denies chest pain but has had some dyspnea.  Previous labs with borderline testosterone.  History of hypothyroidism, TSH previously slightly low.  Chronic pain remains well controlled with oxycodone.  This was previously managed by his neurosurgeon until his retirement.  He has been on a stable dose and has not needed escalation.  ROS:  A comprehensive ROS was completed and negative except as noted per HPI  No Known Allergies  Past Medical History:  Diagnosis Date   Hernia, abdominal 2008   Hyperlipidemia    Hypertension    Hypothyroidism    Sleep apnea    CPAP   Smoker    Thyroid disease     Past Surgical History:  Procedure Laterality Date   BACK SURGERY  2017    Social History   Socioeconomic History   Marital status: Married    Spouse name: Not on file   Number of children: Not on file   Years of education: Not on file   Highest education level: Not asked  Occupational History   Occupation: Retired  Tobacco Use   Smoking status: Every Day    Packs/day: 0.75    Years: 30.00    Total pack years: 22.50    Types: Cigarettes   Smokeless tobacco: Never  Vaping Use   Vaping Use: Never used  Substance and Sexual Activity   Alcohol use: Yes    Alcohol/week: 12.0 standard drinks of alcohol    Types: 12 Cans of beer per week   Drug use: Not Currently   Sexual  activity: Yes    Partners: Female  Other Topics Concern   Not on file  Social History Narrative   Not on file   Social Determinants of Health   Financial Resource Strain: Not on file  Food Insecurity: Not on file  Transportation Needs: Not on file  Physical Activity: Not on file  Stress: Not on file  Social Connections: Not on file    Family History  Problem Relation Age of Onset   Rheum arthritis Mother    Mitral valve prolapse Mother    Stomach cancer Maternal Grandmother    Colon cancer Neg Hx    Colon polyps Neg Hx    Esophageal cancer Neg Hx    Rectal cancer Neg Hx     Health Maintenance  Topic Date Due   Lung Cancer Screening  Never done   COVID-19 Vaccine (3 - 2023-24 season) 10/07/2021   COLONOSCOPY (Pts 45-70yr Insurance coverage will need to be confirmed)  11/19/2021   INFLUENZA VACCINE  05/07/2022 (Originally 09/06/2021)   Hepatitis C Screening  09/21/2022 (Originally 08/02/1986)   HIV Screening  09/21/2022 (Originally 08/02/1983)   DTaP/Tdap/Td (5 - Td or Tdap) 03/21/2027   Zoster Vaccines- Shingrix  Completed   HPV VACCINES  Aged Out     ----------------------------------------------------------------------------------------------------------------------------------------------------------------------------------------------------------------- Physical Exam BP (!) 143/83 (BP Location: Left Arm, Patient Position: Sitting, Cuff Size: Large)   Pulse 93   Ht '5\' 8"'$  (1.727 m)   Wt 233 lb (105.7 kg)   SpO2 97%   BMI 35.43 kg/m   Physical Exam Constitutional:      Appearance: Normal appearance.  HENT:     Head: Normocephalic and atraumatic.  Eyes:     General: No scleral icterus. Cardiovascular:     Rate and Rhythm: Normal rate and regular rhythm.  Pulmonary:     Effort: Pulmonary effort is normal.     Breath sounds: Normal breath sounds.  Musculoskeletal:     Cervical back: Neck supple.  Neurological:     Mental Status: He is alert.  Psychiatric:         Mood and Affect: Mood normal.        Behavior: Behavior normal.     ------------------------------------------------------------------------------------------------------------------------------------------------------------------------------------------------------------------- Assessment and Plan  Hypothyroidism He has noted some increased fatigue.  Checking TSH today.  Lumbar spondylosis He has been on a stable dose of oxycodone.  We will continue this at current strength.  Updating UDS today.  At high risk for cardiovascular disease The 10-year ASCVD risk score (Arnett DK, et al., 2019) is: 15.1%   Values used to calculate the score:     Age: 64 years     Sex: Male     Is Non-Hispanic African American: No     Diabetic: No     Tobacco smoker: Yes     Systolic Blood Pressure: 364 mmHg     Is BP treated: Yes     HDL Cholesterol: 54 mg/dL     Total Cholesterol: 242 mg/dL Recommend coronary calcium score.  Smoking greater than 20 pack years He is interested in having lung cancer screening with low-dose CT scan.  Counseled on smoking cessation.  Did not tolerate Chantix well.  Adding bupropion and will taper NicoDerm patches over the next 6 to 8 weeks.  Other fatigue Updating labs today.   Meds ordered this encounter  Medications   buPROPion (WELLBUTRIN XL) 150 MG 24 hr tablet    Sig: Take 1 tablet (150 mg total) by mouth daily.    Dispense:  90 tablet    Refill:  0   nicotine (NICODERM CQ) 21 mg/24hr patch    Sig: Place 1 patch (21 mg total) onto the skin daily. Use x14 days    Dispense:  14 patch    Refill:  0   nicotine (NICODERM CQ) 14 mg/24hr patch    Sig: Place 1 patch (14 mg total) onto the skin daily. Use x14 days after completion of '21mg'$  patch    Dispense:  14 patch    Refill:  0   nicotine (NICODERM CQ) 7 mg/24hr patch    Sig: Place 1 patch (7 mg total) onto the skin daily. Use x28 days after completion of '14mg'$  patch    Dispense:  28 patch     Refill:  0    No follow-ups on file.    This visit occurred during the SARS-CoV-2 public health emergency.  Safety protocols were in place, including screening questions prior to the visit, additional usage of staff PPE, and extensive cleaning of exam room while observing appropriate contact time as indicated for disinfecting solutions.

## 2022-01-09 NOTE — Assessment & Plan Note (Signed)
The 10-year ASCVD risk score (Arnett DK, et al., 2019) is: 15.1%   Values used to calculate the score:     Age: 51 years     Sex: Male     Is Non-Hispanic African American: No     Diabetic: No     Tobacco smoker: Yes     Systolic Blood Pressure: 168 mmHg     Is BP treated: Yes     HDL Cholesterol: 54 mg/dL     Total Cholesterol: 242 mg/dL Recommend coronary calcium score.

## 2022-01-09 NOTE — Assessment & Plan Note (Signed)
Updating labs today.

## 2022-01-09 NOTE — Assessment & Plan Note (Signed)
He has been on a stable dose of oxycodone.  We will continue this at current strength.  Updating UDS today.

## 2022-01-09 NOTE — Assessment & Plan Note (Signed)
He is interested in having lung cancer screening with low-dose CT scan.  Counseled on smoking cessation.  Did not tolerate Chantix well.  Adding bupropion and will taper NicoDerm patches over the next 6 to 8 weeks.

## 2022-01-09 NOTE — Assessment & Plan Note (Signed)
He has noted some increased fatigue.  Checking TSH today.

## 2022-01-10 ENCOUNTER — Ambulatory Visit (INDEPENDENT_AMBULATORY_CARE_PROVIDER_SITE_OTHER): Payer: Self-pay

## 2022-01-10 ENCOUNTER — Ambulatory Visit (INDEPENDENT_AMBULATORY_CARE_PROVIDER_SITE_OTHER)

## 2022-01-10 DIAGNOSIS — E782 Mixed hyperlipidemia: Secondary | ICD-10-CM

## 2022-01-10 DIAGNOSIS — F17210 Nicotine dependence, cigarettes, uncomplicated: Secondary | ICD-10-CM

## 2022-01-10 DIAGNOSIS — F17200 Nicotine dependence, unspecified, uncomplicated: Secondary | ICD-10-CM

## 2022-01-10 DIAGNOSIS — I10 Essential (primary) hypertension: Secondary | ICD-10-CM

## 2022-01-10 LAB — COMPLETE METABOLIC PANEL WITH GFR
AG Ratio: 1.6 (calc) (ref 1.0–2.5)
ALT: 22 U/L (ref 9–46)
AST: 19 U/L (ref 10–35)
Albumin: 4.9 g/dL (ref 3.6–5.1)
Alkaline phosphatase (APISO): 52 U/L (ref 35–144)
BUN: 12 mg/dL (ref 7–25)
CO2: 29 mmol/L (ref 20–32)
Calcium: 9.8 mg/dL (ref 8.6–10.3)
Chloride: 98 mmol/L (ref 98–110)
Creat: 1.04 mg/dL (ref 0.70–1.30)
Globulin: 3.1 g/dL (calc) (ref 1.9–3.7)
Glucose, Bld: 135 mg/dL — ABNORMAL HIGH (ref 65–99)
Potassium: 4.7 mmol/L (ref 3.5–5.3)
Sodium: 136 mmol/L (ref 135–146)
Total Bilirubin: 0.6 mg/dL (ref 0.2–1.2)
Total Protein: 8 g/dL (ref 6.1–8.1)
eGFR: 86 mL/min/{1.73_m2} (ref >=60)

## 2022-01-10 LAB — CBC WITH DIFFERENTIAL/PLATELET
Absolute Monocytes: 467 cells/uL (ref 200–950)
Basophils Absolute: 40 cells/uL (ref 0–200)
Basophils Relative: 0.7 %
Eosinophils Absolute: 171 cells/uL (ref 15–500)
Eosinophils Relative: 3 %
HCT: 49.6 % (ref 38.5–50.0)
Hemoglobin: 17 g/dL (ref 13.2–17.1)
Lymphs Abs: 2132 cells/uL (ref 850–3900)
MCH: 31.5 pg (ref 27.0–33.0)
MCHC: 34.3 g/dL (ref 32.0–36.0)
MCV: 91.9 fL (ref 80.0–100.0)
MPV: 9.6 fL (ref 7.5–12.5)
Monocytes Relative: 8.2 %
Neutro Abs: 2890 cells/uL (ref 1500–7800)
Neutrophils Relative %: 50.7 %
Platelets: 325 10*3/uL (ref 140–400)
RBC: 5.4 10*6/uL (ref 4.20–5.80)
RDW: 11.8 % (ref 11.0–15.0)
Total Lymphocyte: 37.4 %
WBC: 5.7 10*3/uL (ref 3.8–10.8)

## 2022-01-10 LAB — TSH+FREE T4: TSH W/REFLEX TO FT4: 1.07 mIU/L (ref 0.40–4.50)

## 2022-01-10 LAB — TESTOSTERONE: Testosterone: 255 ng/dL (ref 250–827)

## 2022-01-10 LAB — VITAMIN D 25 HYDROXY (VIT D DEFICIENCY, FRACTURES): Vit D, 25-Hydroxy: 22 ng/mL — ABNORMAL LOW (ref 30–100)

## 2022-01-10 LAB — VITAMIN B12: Vitamin B-12: 258 pg/mL (ref 200–1100)

## 2022-01-11 ENCOUNTER — Encounter: Payer: Self-pay | Admitting: Family Medicine

## 2022-01-11 LAB — DRUG MONITOR, PANEL 5, W/CONF, URINE
Amphetamines: NEGATIVE ng/mL (ref <500)
Barbiturates: NEGATIVE ng/mL (ref <300)
Benzodiazepines: NEGATIVE ng/mL (ref <100)
Cocaine Metabolite: NEGATIVE ng/mL (ref <150)
Codeine: NEGATIVE ng/mL (ref <50)
Creatinine: 176.1 mg/dL (ref >=20.0)
Hydrocodone: NEGATIVE ng/mL (ref <50)
Hydromorphone: NEGATIVE ng/mL (ref <50)
Marijuana Metabolite: NEGATIVE ng/mL (ref <20)
Methadone Metabolite: NEGATIVE ng/mL (ref <100)
Morphine: NEGATIVE ng/mL (ref <50)
Norhydrocodone: NEGATIVE ng/mL (ref <50)
Noroxycodone: 893 ng/mL — ABNORMAL HIGH (ref <50)
Opiates: NEGATIVE ng/mL (ref <100)
Oxidant: NEGATIVE ug/mL (ref <200)
Oxycodone: 442 ng/mL — ABNORMAL HIGH (ref <50)
Oxycodone: POSITIVE ng/mL — AB (ref <100)
Oxymorphone: 509 ng/mL — ABNORMAL HIGH (ref <50)
pH: 5.8 (ref 4.5–9.0)

## 2022-01-11 LAB — DM TEMPLATE

## 2022-01-11 MED ORDER — BUPROPION HCL ER (XL) 150 MG PO TB24: 150.0000 mg | ORAL_TABLET | Freq: Every day | ORAL | 0 refills | Status: DC

## 2022-01-11 MED ORDER — NICOTINE 21 MG/24HR TD PT24: 21.0000 mg | MEDICATED_PATCH | Freq: Every day | TRANSDERMAL | 0 refills | Status: DC

## 2022-01-11 MED ORDER — NICOTINE 7 MG/24HR TD PT24: 7.0000 mg | MEDICATED_PATCH | Freq: Every day | TRANSDERMAL | 0 refills | Status: DC

## 2022-01-11 MED ORDER — NICOTINE 14 MG/24HR TD PT24: 14.0000 mg | MEDICATED_PATCH | Freq: Every day | TRANSDERMAL | 0 refills | Status: DC

## 2022-01-11 NOTE — Telephone Encounter (Signed)
Rx sent to [facility]

## 2022-01-12 ENCOUNTER — Other Ambulatory Visit

## 2022-01-13 ENCOUNTER — Other Ambulatory Visit: Payer: Self-pay | Admitting: Family Medicine

## 2022-01-13 MED ORDER — ROSUVASTATIN CALCIUM 20 MG PO TABS: 20.0000 mg | ORAL_TABLET | Freq: Every day | ORAL | 3 refills | Status: DC

## 2022-01-17 ENCOUNTER — Encounter: Payer: Self-pay | Admitting: Family Medicine

## 2022-01-18 ENCOUNTER — Other Ambulatory Visit: Payer: Self-pay | Admitting: Family Medicine

## 2022-01-18 MED ORDER — CYANOCOBALAMIN 1000 MCG/ML IJ KIT: 1000.0000 ug | PACK | INTRAMUSCULAR | 11 refills | Status: DC

## 2022-01-23 ENCOUNTER — Other Ambulatory Visit: Payer: Self-pay | Admitting: Family Medicine

## 2022-01-24 MED ORDER — OXYCODONE-ACETAMINOPHEN 5-325 MG PO TABS: 1.0000 | ORAL_TABLET | Freq: Four times a day (QID) | ORAL | 0 refills | Status: DC | PRN

## 2022-01-24 NOTE — Telephone Encounter (Signed)
Last OV: 01/09/22 Next OV: no appt scheduled Last RF: 12/27/21

## 2022-01-31 ENCOUNTER — Other Ambulatory Visit: Payer: Self-pay | Admitting: Family Medicine

## 2022-02-28 ENCOUNTER — Other Ambulatory Visit: Payer: Self-pay | Admitting: Family Medicine

## 2022-03-01 MED ORDER — OXYCODONE-ACETAMINOPHEN 5-325 MG PO TABS: 1.0000 | ORAL_TABLET | Freq: Four times a day (QID) | ORAL | 0 refills | Status: DC | PRN

## 2022-03-15 ENCOUNTER — Other Ambulatory Visit: Payer: Self-pay

## 2022-03-15 MED ORDER — FLUTICASONE-SALMETEROL 250-50 MCG/ACT IN AEPB: 1.0000 | INHALATION_SPRAY | Freq: Two times a day (BID) | RESPIRATORY_TRACT | 0 refills | Status: DC

## 2022-03-15 MED ORDER — FLUTICASONE-SALMETEROL 250-50 MCG/ACT IN AEPB: 1.0000 | INHALATION_SPRAY | Freq: Two times a day (BID) | RESPIRATORY_TRACT | 1 refills | Status: DC

## 2022-03-23 ENCOUNTER — Encounter: Payer: Self-pay | Admitting: Family Medicine

## 2022-03-24 MED ORDER — FLUTICASONE-SALMETEROL 250-50 MCG/ACT IN AEPB: 1.0000 | INHALATION_SPRAY | Freq: Two times a day (BID) | RESPIRATORY_TRACT | 1 refills | Status: DC

## 2022-04-03 ENCOUNTER — Other Ambulatory Visit: Payer: Self-pay | Admitting: Medical-Surgical

## 2022-04-04 MED ORDER — OXYCODONE-ACETAMINOPHEN 5-325 MG PO TABS: 1.0000 | ORAL_TABLET | Freq: Four times a day (QID) | ORAL | 0 refills | Status: DC | PRN

## 2022-05-02 ENCOUNTER — Encounter: Payer: Self-pay | Admitting: Family Medicine

## 2022-05-02 ENCOUNTER — Other Ambulatory Visit: Payer: Self-pay | Admitting: Family Medicine

## 2022-05-02 MED ORDER — OXYCODONE-ACETAMINOPHEN 5-325 MG PO TABS: 1.0000 | ORAL_TABLET | Freq: Four times a day (QID) | ORAL | 0 refills | Status: DC | PRN

## 2022-05-02 MED ORDER — ALBUTEROL SULFATE HFA 108 (90 BASE) MCG/ACT IN AERS: 2.0000 | INHALATION_SPRAY | Freq: Four times a day (QID) | RESPIRATORY_TRACT | 3 refills | Status: DC | PRN

## 2022-06-07 ENCOUNTER — Other Ambulatory Visit: Payer: Self-pay | Admitting: Family Medicine

## 2022-06-08 MED ORDER — OXYCODONE-ACETAMINOPHEN 5-325 MG PO TABS: 1.0000 | ORAL_TABLET | Freq: Four times a day (QID) | ORAL | 0 refills | Status: DC | PRN

## 2022-07-11 ENCOUNTER — Other Ambulatory Visit: Payer: Self-pay | Admitting: Family Medicine

## 2022-07-12 MED ORDER — OXYCODONE-ACETAMINOPHEN 5-325 MG PO TABS: 1.0000 | ORAL_TABLET | Freq: Four times a day (QID) | ORAL | 0 refills | Status: DC | PRN

## 2022-07-12 NOTE — Telephone Encounter (Signed)
Rx renewed.  Needs appt for this month.

## 2022-07-13 NOTE — Telephone Encounter (Signed)
Patient scheduled.

## 2022-07-25 ENCOUNTER — Ambulatory Visit (INDEPENDENT_AMBULATORY_CARE_PROVIDER_SITE_OTHER): Admitting: Family Medicine

## 2022-07-25 ENCOUNTER — Encounter: Payer: Self-pay | Admitting: Family Medicine

## 2022-07-25 ENCOUNTER — Other Ambulatory Visit: Payer: Self-pay | Admitting: Family Medicine

## 2022-07-25 VITALS — BP 164/108 | HR 80 | Ht 68.0 in | Wt 227.0 lb

## 2022-07-25 DIAGNOSIS — E038 Other specified hypothyroidism: Secondary | ICD-10-CM

## 2022-07-25 DIAGNOSIS — E063 Autoimmune thyroiditis: Secondary | ICD-10-CM

## 2022-07-25 DIAGNOSIS — M961 Postlaminectomy syndrome, not elsewhere classified: Secondary | ICD-10-CM

## 2022-07-25 DIAGNOSIS — Z1211 Encounter for screening for malignant neoplasm of colon: Secondary | ICD-10-CM | POA: Diagnosis not present

## 2022-07-25 DIAGNOSIS — E782 Mixed hyperlipidemia: Secondary | ICD-10-CM

## 2022-07-25 DIAGNOSIS — I10 Essential (primary) hypertension: Secondary | ICD-10-CM

## 2022-07-25 DIAGNOSIS — K439 Ventral hernia without obstruction or gangrene: Secondary | ICD-10-CM | POA: Diagnosis not present

## 2022-07-25 DIAGNOSIS — M47816 Spondylosis without myelopathy or radiculopathy, lumbar region: Secondary | ICD-10-CM

## 2022-07-25 DIAGNOSIS — J42 Unspecified chronic bronchitis: Secondary | ICD-10-CM

## 2022-07-25 NOTE — Assessment & Plan Note (Signed)
Blood pressure is elevated.  Increasing lisinopril to 40 mg daily.  Return in 2 weeks for blood pressure recheck.

## 2022-07-25 NOTE — Progress Notes (Signed)
Reginald Salazar - 52 y.o. male MRN [id number]  Date of birth: [birth year]  Subjective Chief Complaint  Patient presents with   Pain Management   Hernia    HPI Reginald Salazar is a 52 year old male here today.  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  He is doing well with this.  He does have some neuropathy.  No side effects from medication.  Blood pressure is elevated today.  He does continue lisinopril daily.  He denies symptoms related to hypertension including chest pain, shortness of breath, palpitations, headaches or vision changes.  He does continue to smoke regularly.  Patches prescribed previously but he was not successful with using these.  Levothyroxine adjusted previously.  Due for updated TSH.  COPD stable with current inhalers.  No side effects with Wixela daily and albuterol as needed.  ROS:  A comprehensive ROS was completed and negative except as noted per HPI   Past Medical History:  Diagnosis Date   Hernia, abdominal 2008   Hyperlipidemia    Hypertension    Hypothyroidism    Sleep apnea    CPAP   Smoker    Thyroid disease     Past Surgical History:  Procedure Laterality Date   BACK SURGERY  2017    Social History   Socioeconomic History   Marital status: Married    Spouse name: Not on file   Number of children: Not on file   Years of education: Not on file   Highest education level: Not asked  Occupational History   Occupation: Retired  Tobacco Use   Smoking status: Every Day    Packs/day: 0.75    Years: 30.00    Additional pack years: 0.00    Total pack years: 22.50    Types: Cigarettes   Smokeless tobacco: Never  Vaping Use   Vaping Use: Never used  Substance and Sexual Activity   Alcohol use: Yes    Alcohol/week: 12.0 standard drinks of alcohol    Types: 12 Cans of beer per week   Drug use: Not Currently   Sexual activity: Yes    Partners: Female  Other Topics  Concern   Not on file  Social History Narrative   Not on file   Social Determinants of Health   Financial Resource Strain: Not on file  Food Insecurity: Not on file  Transportation Needs: Not on file  Physical Activity: Not on file  Stress: Not on file  Social Connections: Not on file    Family History  Problem Relation Age of Onset   Rheum arthritis Mother    Mitral valve prolapse Mother    Stomach cancer Maternal Grandmother    Colon cancer Neg Hx    Colon polyps Neg Hx    Esophageal cancer Neg Hx    Rectal cancer Neg Hx     Health Maintenance  Topic Date Due   Hepatitis C Screening  09/21/2022 (Originally 08/02/1986)   HIV Screening  09/21/2022 (Originally 08/02/1983)   COVID-19 Vaccine (3 - 2023-24 season) 01/10/2023 (Originally 10/07/2021)   Colonoscopy  07/25/2023 (Originally 11/19/2021)   INFLUENZA VACCINE  09/07/2022   Lung Cancer Screening  01/11/2023   DTaP/Tdap/Td (5 - Td or Tdap) 03/21/2027   Zoster Vaccines- Shingrix  Completed   HPV VACCINES  Aged Out     ----------------------------------------------------------------------------------------------------------------------------------------------------------------------------------------------------------------- Physical Exam BP (!) 164/108 (BP Location: Left Arm, Patient Position: Sitting, Cuff Size: Large)   Pulse 80  Ht 5\' 8"  (1.727 m)   Wt 227 lb (103 kg)   SpO2 98%   BMI 34.52 kg/m   Physical Exam Constitutional:      Appearance: Normal appearance.  HENT:     Head: Normocephalic and atraumatic.  Eyes:     General: No scleral icterus. Cardiovascular:     Rate and Rhythm: Normal rate and regular rhythm.  Pulmonary:     Effort: Pulmonary effort is normal.     Breath sounds: Normal breath sounds.  Musculoskeletal:     Cervical back: Neck supple.  Neurological:     General: No focal deficit present.     Mental Status: He is alert.  Psychiatric:        Mood and Affect: Mood normal.         Behavior: Behavior normal.     ------------------------------------------------------------------------------------------------------------------------------------------------------------------------------------------------------------------- Assessment and Plan  Hypertension Blood pressure is elevated.  Increasing lisinopril to 40 mg daily.  Return in 2 weeks for blood pressure recheck.  Chronic bronchitis (HCC) Continue Wixela daily with albuterol as needed.  Encourage smoking cessation.  Hypothyroidism Updated TSH ordered today.  Mixed hyperlipidemia He has been taking atorvastatin more consistently at this point.  Will plan to continue this.  Updating lipid panel and hepatic panel.  Lumbar spondylosis Remains on stable dose of oxycodone.  Will plan to continue this at current strength.  Updated pain contract completed and urine drug screen ordered.  PDMP reviewed.   No orders of the defined types were placed in this encounter.   Return in about 6 months (around 01/24/2023) for annual exam/fasting labs.    This visit occurred during the SARS-CoV-2 public health emergency.  Safety protocols were in place, including screening questions prior to the visit, additional usage of staff PPE, and extensive cleaning of exam room while observing appropriate contact time as indicated for disinfecting solutions.  He was cleared by feet rather than eating just a milkshake was elevated with freezer burn milkshake I cannot get it out right now either admit  SOB last time on the milkshake that

## 2022-07-25 NOTE — Assessment & Plan Note (Signed)
He has been taking atorvastatin more consistently at this point.  Will plan to continue this.  Updating lipid panel and hepatic panel.

## 2022-07-25 NOTE — Assessment & Plan Note (Signed)
Continue Wixela daily with albuterol as needed.  Encourage smoking cessation.

## 2022-07-25 NOTE — Assessment & Plan Note (Signed)
Updated TSH ordered today.   

## 2022-07-25 NOTE — Assessment & Plan Note (Signed)
Remains on stable dose of oxycodone.  Will plan to continue this at current strength.  Updated pain contract completed and urine drug screen ordered.  PDMP reviewed.

## 2022-07-26 LAB — LDL CHOLESTEROL, DIRECT: Direct LDL: 140 mg/dL — ABNORMAL HIGH (ref <100)

## 2022-07-27 LAB — DRUG MONITOR, PANEL 5, W/CONF, URINE
Amphetamines: NEGATIVE ng/mL (ref <500)
Barbiturates: NEGATIVE ng/mL (ref <300)
Benzodiazepines: NEGATIVE ng/mL (ref <100)
Cocaine Metabolite: NEGATIVE ng/mL (ref <150)
Codeine: NEGATIVE ng/mL (ref <50)
Creatinine: 84.1 mg/dL (ref >=20.0)
Hydrocodone: NEGATIVE ng/mL (ref <50)
Hydromorphone: NEGATIVE ng/mL (ref <50)
Marijuana Metabolite: NEGATIVE ng/mL (ref <20)
Methadone Metabolite: NEGATIVE ng/mL (ref <100)
Morphine: NEGATIVE ng/mL (ref <50)
Norhydrocodone: NEGATIVE ng/mL (ref <50)
Noroxycodone: 841 ng/mL — ABNORMAL HIGH (ref <50)
Opiates: NEGATIVE ng/mL (ref <100)
Oxidant: NEGATIVE ug/mL (ref <200)
Oxycodone: 418 ng/mL — ABNORMAL HIGH (ref <50)
Oxycodone: POSITIVE ng/mL — AB (ref <100)
Oxymorphone: 660 ng/mL — ABNORMAL HIGH (ref <50)
pH: 7 (ref 4.5–9.0)

## 2022-07-27 LAB — TSH: TSH: 0.12 mIU/L — ABNORMAL LOW (ref 0.40–4.50)

## 2022-07-27 LAB — DM TEMPLATE

## 2022-07-28 ENCOUNTER — Other Ambulatory Visit: Payer: Self-pay | Admitting: Family Medicine

## 2022-07-28 DIAGNOSIS — E039 Hypothyroidism, unspecified: Secondary | ICD-10-CM

## 2022-07-28 MED ORDER — SYNTHROID 175 MCG PO TABS: 175.0000 ug | ORAL_TABLET | Freq: Every day | ORAL | 1 refills | Status: DC

## 2022-07-28 MED ORDER — SYNTHROID 175 MCG PO TABS
175.0000 ug | ORAL_TABLET | Freq: Every day | ORAL | 1 refills | Status: DC
Start: 2022-07-28 — End: 2023-01-25

## 2022-08-09 ENCOUNTER — Ambulatory Visit

## 2022-08-14 ENCOUNTER — Other Ambulatory Visit: Payer: Self-pay | Admitting: Family Medicine

## 2022-08-15 MED ORDER — OXYCODONE-ACETAMINOPHEN 5-325 MG PO TABS: 1.0000 | ORAL_TABLET | Freq: Four times a day (QID) | ORAL | 0 refills | Status: DC | PRN

## 2022-09-12 ENCOUNTER — Other Ambulatory Visit: Payer: Self-pay | Admitting: Family Medicine

## 2022-09-13 MED ORDER — OXYCODONE-ACETAMINOPHEN 5-325 MG PO TABS: 1.0000 | ORAL_TABLET | Freq: Four times a day (QID) | ORAL | 0 refills | Status: DC | PRN

## 2022-10-02 ENCOUNTER — Other Ambulatory Visit: Payer: Self-pay | Admitting: Family Medicine

## 2022-10-02 DIAGNOSIS — J42 Unspecified chronic bronchitis: Secondary | ICD-10-CM

## 2022-10-03 ENCOUNTER — Encounter: Payer: Self-pay | Admitting: Family Medicine

## 2022-10-04 ENCOUNTER — Ambulatory Visit: Admitting: Sports Medicine

## 2022-10-04 NOTE — Telephone Encounter (Signed)
Patient scheduled with Dr Thekkekandam.  

## 2022-10-05 ENCOUNTER — Other Ambulatory Visit (INDEPENDENT_AMBULATORY_CARE_PROVIDER_SITE_OTHER)

## 2022-10-05 ENCOUNTER — Ambulatory Visit: Admitting: Sports Medicine

## 2022-10-05 ENCOUNTER — Encounter: Payer: Self-pay | Admitting: Sports Medicine

## 2022-10-05 DIAGNOSIS — M7541 Impingement syndrome of right shoulder: Secondary | ICD-10-CM | POA: Diagnosis not present

## 2022-10-05 MED ORDER — TRIAMCINOLONE ACETONIDE 40 MG/ML IJ SUSP
40.0000 mg | Freq: Once | INTRAMUSCULAR | Status: AC
Start: 2022-10-05 — End: 2022-10-05
  Administered 2022-10-05: 40 mg via INTRAMUSCULAR

## 2022-10-05 NOTE — Assessment & Plan Note (Signed)
This is a very 52 year old male, he has a 40-month history of right shoulder pain localized over the deltoid and worse with abduction. He has been treated in the past with a subacromial injection maybe a decade ago and did really well. On exam today he has positive impingement signs, he has a negative Spurling sign. Pain is localized over the deltoid. Per his request we treated him aggressively today with a ultrasound-guided subacromial bursa injection, the bursa did appear slightly distended today. We discussed the anatomy and force coupling function of the rotator cuff and I would like him to do rotator cuff conditioning until he sees me back in about 6 weeks.

## 2022-10-05 NOTE — Progress Notes (Signed)
    Procedures performed today:    Procedure: Real-time Ultrasound Guided injection of the right subacromial bursa Device: Samsung HS60  Verbal informed consent obtained.  Time-out conducted.  Noted no overlying erythema, induration, or other signs of local infection.  Skin prepped in a sterile fashion.  Local anesthesia: Topical Ethyl chloride.  With sterile technique and under real time ultrasound guidance: Noted minimally distended bursa and intact rotator cuff, 1 cc Kenalog 40, 1 cc lidocaine, 1 cc bupivacaine injected easily Completed without difficulty  Advised to call if fevers/chills, erythema, induration, drainage, or persistent bleeding.  Images permanently stored and available for review in PACS.  Impression: Technically successful ultrasound guided injection.  Independent interpretation of notes and tests performed by another provider:   None.  Brief History, Exam, Impression, and Recommendations:    Impingement syndrome, shoulder, right This is a very 52 year old male, he has a 47-month history of right shoulder pain localized over the deltoid and worse with abduction. He has been treated in the past with a subacromial injection maybe a decade ago and did really well. On exam today he has positive impingement signs, he has a negative Spurling sign. Pain is localized over the deltoid. Per his request we treated him aggressively today with a ultrasound-guided subacromial bursa injection, the bursa did appear slightly distended today. We discussed the anatomy and force coupling function of the rotator cuff and I would like him to do rotator cuff conditioning until he sees me back in about 6 weeks.    ____________________________________________ Ihor Austin. Benjamin Stain, M.D., ABFM., CAQSM., AME. Primary Care and Sports Medicine [facility]  Adjunct Professor of Family Medicine  [employer]  Stage manager

## 2022-10-05 NOTE — Addendum Note (Signed)
Addended by: Carren Rang A on: 10/05/2022 01:48 PM   Modules accepted: Orders

## 2022-10-19 ENCOUNTER — Other Ambulatory Visit: Payer: Self-pay | Admitting: Family Medicine

## 2022-10-20 NOTE — Telephone Encounter (Signed)
Requesting rx rf of oxycodone - acet 5-325 Last written 09/13/2022 Last OV 07/25/2022 Next schld appt 11/16/2022

## 2022-10-23 MED ORDER — OXYCODONE-ACETAMINOPHEN 5-325 MG PO TABS: 1.0000 | ORAL_TABLET | Freq: Four times a day (QID) | ORAL | 0 refills | Status: DC | PRN

## 2022-11-16 ENCOUNTER — Ambulatory Visit: Admitting: Family Medicine

## 2022-11-22 ENCOUNTER — Other Ambulatory Visit: Payer: Self-pay | Admitting: Family Medicine

## 2022-11-23 MED ORDER — OXYCODONE-ACETAMINOPHEN 5-325 MG PO TABS: 1.0000 | ORAL_TABLET | Freq: Four times a day (QID) | ORAL | 0 refills | Status: DC | PRN

## 2022-11-23 NOTE — Telephone Encounter (Signed)
Requesting rx rf of  oxycodone- acetaminophen 5-325 Last written 10/23/2022 Last OV 10/05/2022 No upcoming appt schld.

## 2022-12-15 ENCOUNTER — Other Ambulatory Visit: Payer: Self-pay | Admitting: Family Medicine

## 2022-12-15 DIAGNOSIS — J42 Unspecified chronic bronchitis: Secondary | ICD-10-CM

## 2022-12-19 ENCOUNTER — Telehealth: Payer: Self-pay | Admitting: Family Medicine

## 2022-12-19 DIAGNOSIS — J42 Unspecified chronic bronchitis: Secondary | ICD-10-CM

## 2022-12-19 NOTE — Telephone Encounter (Signed)
Patient states that pharmacy does not have the albuterol rx I their systems, please resend

## 2022-12-20 ENCOUNTER — Other Ambulatory Visit: Payer: Self-pay | Admitting: Family Medicine

## 2022-12-20 DIAGNOSIS — J42 Unspecified chronic bronchitis: Secondary | ICD-10-CM

## 2022-12-20 MED ORDER — ALBUTEROL SULFATE HFA 108 (90 BASE) MCG/ACT IN AERS
1.0000 | INHALATION_SPRAY | Freq: Four times a day (QID) | RESPIRATORY_TRACT | 3 refills | Status: DC | PRN
Start: 2022-12-20 — End: 2023-02-02

## 2022-12-20 NOTE — Telephone Encounter (Signed)
Rx sent to the pharmacy.

## 2022-12-25 ENCOUNTER — Other Ambulatory Visit: Payer: Self-pay | Admitting: Family Medicine

## 2022-12-26 MED ORDER — OXYCODONE-ACETAMINOPHEN 5-325 MG PO TABS: 1.0000 | ORAL_TABLET | Freq: Four times a day (QID) | ORAL | 0 refills | Status: DC | PRN

## 2023-01-24 ENCOUNTER — Other Ambulatory Visit: Payer: Self-pay | Admitting: Family Medicine

## 2023-01-25 MED ORDER — OXYCODONE-ACETAMINOPHEN 5-325 MG PO TABS: 1.0000 | ORAL_TABLET | Freq: Four times a day (QID) | ORAL | 0 refills | Status: DC | PRN

## 2023-02-02 ENCOUNTER — Other Ambulatory Visit: Payer: Self-pay

## 2023-02-02 DIAGNOSIS — J42 Unspecified chronic bronchitis: Secondary | ICD-10-CM

## 2023-02-02 MED ORDER — ALBUTEROL SULFATE HFA 108 (90 BASE) MCG/ACT IN AERS: 1.0000 | INHALATION_SPRAY | Freq: Four times a day (QID) | RESPIRATORY_TRACT | 3 refills | Status: DC | PRN

## 2023-02-14 ENCOUNTER — Other Ambulatory Visit: Payer: Self-pay

## 2023-02-14 MED ORDER — CYANOCOBALAMIN 1000 MCG/ML IJ KIT: 1000.0000 ug | PACK | INTRAMUSCULAR | 11 refills | Status: DC

## 2023-02-27 ENCOUNTER — Other Ambulatory Visit: Payer: Self-pay | Admitting: Family Medicine

## 2023-02-28 MED ORDER — OXYCODONE-ACETAMINOPHEN 5-325 MG PO TABS: 1.0000 | ORAL_TABLET | Freq: Four times a day (QID) | ORAL | 0 refills | Status: DC | PRN

## 2023-03-27 ENCOUNTER — Other Ambulatory Visit: Payer: Self-pay | Admitting: Family Medicine

## 2023-03-27 ENCOUNTER — Encounter: Payer: Self-pay | Admitting: Family Medicine

## 2023-03-27 MED ORDER — OXYCODONE-ACETAMINOPHEN 5-325 MG PO TABS: 1.0000 | ORAL_TABLET | Freq: Four times a day (QID) | ORAL | 0 refills | Status: DC | PRN

## 2023-04-05 ENCOUNTER — Other Ambulatory Visit: Payer: Self-pay | Admitting: Family Medicine

## 2023-04-05 ENCOUNTER — Encounter: Admitting: Family Medicine

## 2023-04-05 DIAGNOSIS — J42 Unspecified chronic bronchitis: Secondary | ICD-10-CM

## 2023-04-05 MED ORDER — ALBUTEROL SULFATE HFA 108 (90 BASE) MCG/ACT IN AERS
1.0000 | INHALATION_SPRAY | Freq: Four times a day (QID) | RESPIRATORY_TRACT | 3 refills | Status: DC | PRN
Start: 2023-04-05 — End: 2023-04-10

## 2023-04-05 NOTE — Telephone Encounter (Signed)
 Last Fill: 02/02/23  Last OV: 07/25/22 Next OV: 04/25/23  Routing to provider for review/authorization.

## 2023-04-05 NOTE — Telephone Encounter (Signed)
 Copied from CRM [id number]. Topic: Clinical - Medication Refill >> Apr 05, 2023 11:04 AM Thomes Dinning wrote: Most Recent Primary Care Visit:  Provider: Monica Becton  Department: [facility]  Visit Type: OFFICE VISIT  Date: 10/05/2022  Medication:  albuterol (VENTOLIN HFA) 108 (90 Base) MCG/ACT inhaler   Has the patient contacted their pharmacy? No (Agent: If no, request that the patient contact the pharmacy for the refill. If patient does not wish to contact the pharmacy document the reason why and proceed with request.) (Agent: If yes, when and what did the pharmacy advise?)  Is this the correct pharmacy for this prescription? Yes If no, delete pharmacy and type the correct one.  This is the patient's preferred pharmacy:   [facility] [address] [place], [place] - [address] [place]. AT [address] [place] [address] Phone: [phone] Fax: [phone]   Has the prescription been filled recently? No  Is the patient out of the medication? Yes  Has the patient been seen for an appointment in the last year OR does the patient have an upcoming appointment? Yes  Can we respond through MyChart? Yes  Agent: Please be advised that Rx refills may take up to 3 business days. We ask that you follow-up with your pharmacy.

## 2023-04-06 ENCOUNTER — Other Ambulatory Visit: Payer: Self-pay | Admitting: Family Medicine

## 2023-04-06 DIAGNOSIS — J42 Unspecified chronic bronchitis: Secondary | ICD-10-CM

## 2023-04-23 ENCOUNTER — Other Ambulatory Visit: Payer: Self-pay | Admitting: Family Medicine

## 2023-04-25 ENCOUNTER — Ambulatory Visit (INDEPENDENT_AMBULATORY_CARE_PROVIDER_SITE_OTHER): Admitting: Family Medicine

## 2023-04-25 ENCOUNTER — Encounter: Payer: Self-pay | Admitting: Family Medicine

## 2023-04-25 VITALS — BP 112/72 | HR 79 | Ht 68.0 in | Wt 227.0 lb

## 2023-04-25 DIAGNOSIS — M47816 Spondylosis without myelopathy or radiculopathy, lumbar region: Secondary | ICD-10-CM

## 2023-04-25 DIAGNOSIS — E782 Mixed hyperlipidemia: Secondary | ICD-10-CM

## 2023-04-25 DIAGNOSIS — E063 Autoimmune thyroiditis: Secondary | ICD-10-CM

## 2023-04-25 DIAGNOSIS — Z0000 Encounter for general adult medical examination without abnormal findings: Secondary | ICD-10-CM | POA: Diagnosis not present

## 2023-04-25 DIAGNOSIS — I10 Essential (primary) hypertension: Secondary | ICD-10-CM | POA: Diagnosis not present

## 2023-04-25 DIAGNOSIS — E559 Vitamin D deficiency, unspecified: Secondary | ICD-10-CM

## 2023-04-25 DIAGNOSIS — J42 Unspecified chronic bronchitis: Secondary | ICD-10-CM | POA: Diagnosis not present

## 2023-04-25 DIAGNOSIS — E538 Deficiency of other specified B group vitamins: Secondary | ICD-10-CM

## 2023-04-25 DIAGNOSIS — Z79899 Other long term (current) drug therapy: Secondary | ICD-10-CM

## 2023-04-25 DIAGNOSIS — F17210 Nicotine dependence, cigarettes, uncomplicated: Secondary | ICD-10-CM

## 2023-04-25 DIAGNOSIS — Z125 Encounter for screening for malignant neoplasm of prostate: Secondary | ICD-10-CM

## 2023-04-25 MED ORDER — FLUTICASONE-SALMETEROL 250-50 MCG/ACT IN AEPB: 1.0000 | INHALATION_SPRAY | Freq: Two times a day (BID) | RESPIRATORY_TRACT | 3 refills | Status: DC

## 2023-04-25 MED ORDER — ALBUTEROL SULFATE HFA 108 (90 BASE) MCG/ACT IN AERS: 2.0000 | INHALATION_SPRAY | Freq: Four times a day (QID) | RESPIRATORY_TRACT | 5 refills | Status: DC | PRN

## 2023-04-25 NOTE — Assessment & Plan Note (Signed)
 Well adult Orders Placed This Encounter  Procedures   CT CHEST LUNG CA SCREEN LOW DOSE W/O CM    Standing Status:   Future    Expiration Date:   04/24/2024    Preferred Imaging Location?:   [facility]   Pneumococcal conjugate vaccine 20-valent (Prevnar 20)   CMP14+EGFR   CBC with Differential/Platelet   Lipid Panel With LDL/HDL Ratio   PSA   TSH   Drug Screen, 5 Panel, Ur   B12   Vitamin D (25 hydroxy)  Screenings; Per lab orders Immunizations: Prevnar 20 Anticipatory guidance/risk factor reduction: Recommendations per AVS

## 2023-04-25 NOTE — Assessment & Plan Note (Signed)
 He is interested in having lung cancer screening with low-dose CT scan.  Counseled on smoking cessation.

## 2023-04-25 NOTE — Patient Instructions (Signed)

## 2023-04-25 NOTE — Progress Notes (Signed)
 Reginald Salazar - 53 y.o. male MRN [id number]  Date of birth: 21-May-1970  Subjective Chief Complaint  Patient presents with   Annual Exam    HPI Reginald Salazar is a 53 year old male here today for annual exam.  He reports that he is doing pretty well at this time.  Feels that he continues to do well with current medication.  Chronic pain remains well-controlled with Percocet.  He has not had any significant side effects from this.    He is moderately active.  Feels like diet is pretty good at this time.  He does continue to smoke regularly.  He is interested in having lung cancer screening.  He has a couple of beers each day on average.  Review of Systems  Constitutional:  Negative for chills, fever, malaise/fatigue and weight loss.  HENT:  Negative for congestion, ear pain and sore throat.   Eyes:  Negative for blurred vision, double vision and pain.  Respiratory:  Negative for cough and shortness of breath.   Cardiovascular:  Negative for chest pain and palpitations.  Gastrointestinal:  Negative for abdominal pain, blood in stool, constipation, heartburn and nausea.  Genitourinary:  Negative for dysuria and urgency.  Musculoskeletal:  Negative for joint pain and myalgias.  Neurological:  Negative for dizziness and headaches.  Endo/Heme/Allergies:  Does not bruise/bleed easily.  Psychiatric/Behavioral:  Negative for depression. The patient is not nervous/anxious and does not have insomnia.     Past Surgical History:  Procedure Laterality Date   BACK SURGERY  2017    Social History   Socioeconomic History   Marital status: Widowed    Spouse name: Not on file   Number of children: Not on file   Years of education: Not on file   Highest education level: Some college, no degree  Occupational History   Occupation: Retired  Tobacco Use   Smoking status: Every Day    Current packs/day: 0.75    Average packs/day: 0.8 packs/day for 30.0 years (22.5 ttl pk-yrs)    Types:  Cigarettes   Smokeless tobacco: Never  Vaping Use   Vaping status: Never Used  Substance and Sexual Activity   Alcohol use: Yes    Alcohol/week: 12.0 standard drinks of alcohol    Types: 12 Cans of beer per week   Drug use: Not Currently   Sexual activity: Yes    Partners: Female  Other Topics Concern   Not on file  Social History Narrative   Not on file   Social Drivers of Health   Financial Resource Strain: Low Risk  (10/04/2022)   Overall Financial Resource Strain (CARDIA)    Difficulty of Paying Living Expenses: Not hard at all  Food Insecurity: No Food Insecurity (10/04/2022)   Hunger Vital Sign    Worried About Running Out of Food in the Last Year: Never true    Ran Out of Food in the Last Year: Never true  Transportation Needs: No Transportation Needs (10/04/2022)   PRAPARE - Administrator, Civil Service (Medical): No    Lack of Transportation (Non-Medical): No  Physical Activity: Sufficiently Active (10/04/2022)   Exercise Vital Sign    Days of Exercise per Week: 2 days    Minutes of Exercise per Session: 90 min  Stress: No Stress Concern Present (10/04/2022)   [employer] of Occupational Health - Occupational Stress Questionnaire    Feeling of Stress : Not at all  Social Connections: Moderately Isolated (10/04/2022)   Social Connection and Isolation  Panel [NHANES]    Frequency of Communication with Friends and Family: More than three times a week    Frequency of Social Gatherings with Friends and Family: Twice a week    Attends Religious Services: Never    Database administrator or Organizations: Yes    Attends Banker Meetings: Never    Marital Status: Widowed    Family History  Problem Relation Age of Onset   Rheum arthritis Mother    Mitral valve prolapse Mother    Stomach cancer Maternal Grandmother    Colon cancer Neg Hx    Colon polyps Neg Hx    Esophageal cancer Neg Hx    Rectal cancer Neg Hx     Health Maintenance   Topic Date Due   HIV Screening  Never done   Hepatitis C Screening  Never done   Lung Cancer Screening  01/11/2023   INFLUENZA VACCINE  05/07/2023 (Originally 09/07/2022)   Colonoscopy  07/25/2023 (Originally 11/19/2021)   COVID-19 Vaccine (3 - Pfizer risk series) 10/11/2023 (Originally 06/16/2019)   DTaP/Tdap/Td (5 - Td or Tdap) 03/21/2027   Pneumococcal Vaccine 54-36 Years old  Completed   Zoster Vaccines- Shingrix  Completed   HPV VACCINES  Aged Out     ----------------------------------------------------------------------------------------------------------------------------------------------------------------------------------------------------------------- Physical Exam BP 112/72   Pulse 79   Ht 5\' 8"  (1.727 m)   Wt 227 lb (103 kg)   SpO2 97%   BMI 34.52 kg/m   Physical Exam Constitutional:      General: He is not in acute distress. HENT:     Head: Normocephalic and atraumatic.     Right Ear: Tympanic membrane and external ear normal.     Left Ear: Tympanic membrane and external ear normal.  Eyes:     General: No scleral icterus. Neck:     Thyroid: No thyromegaly.  Cardiovascular:     Rate and Rhythm: Normal rate and regular rhythm.     Heart sounds: Normal heart sounds.  Pulmonary:     Effort: Pulmonary effort is normal.     Breath sounds: Normal breath sounds.  Abdominal:     General: Bowel sounds are normal. There is no distension.     Palpations: Abdomen is soft.     Tenderness: There is no abdominal tenderness. There is no guarding.  Musculoskeletal:     Cervical back: Normal range of motion.  Lymphadenopathy:     Cervical: No cervical adenopathy.  Skin:    General: Skin is warm and dry.     Findings: No rash.  Neurological:     Mental Status: He is alert and oriented to person, place, and time.     Cranial Nerves: No cranial nerve deficit.     Motor: No abnormal muscle tone.  Psychiatric:        Mood and Affect: Mood normal.        Behavior:  Behavior normal.     ------------------------------------------------------------------------------------------------------------------------------------------------------------------------------------------------------------------- Assessment and Plan  Smoking greater than 20 pack years He is interested in having lung cancer screening with low-dose CT scan.  Counseled on smoking cessation.   Lumbar spondylosis Remains on stable dose of oxycodone.  Will plan to continue this at current strength.  Updated pain contract completed and urine drug screen ordered.  PDMP reviewed.  Well adult exam Well adult Orders Placed This Encounter  Procedures   CT CHEST LUNG CA SCREEN LOW DOSE W/O CM    Standing Status:   Future    Expiration Date:  04/24/2024    Preferred Imaging Location?:   [facility]   Pneumococcal conjugate vaccine 20-valent (Prevnar 20)   CMP14+EGFR   CBC with Differential/Platelet   Lipid Panel With LDL/HDL Ratio   PSA   TSH   Drug Screen, 5 Panel, Ur   B12   Vitamin D (25 hydroxy)  Screenings; Per lab orders Immunizations: Prevnar 20 Anticipatory guidance/risk factor reduction: Recommendations per AVS   Meds ordered this encounter  Medications   fluticasone-salmeterol (WIXELA INHUB) 250-50 MCG/ACT AEPB    Sig: Inhale 1 puff into the lungs in the morning and at bedtime.    Dispense:  60 each    Refill:  3   albuterol (VENTOLIN HFA) 108 (90 Base) MCG/ACT inhaler    Sig: Inhale 2 puffs into the lungs every 6 (six) hours as needed for wheezing or shortness of breath.    Dispense:  6.7 g    Refill:  5    Return in about 6 months (around 10/26/2023) for Hypertension, chronic pain.    This visit occurred during the SARS-CoV-2 public health emergency.  Safety protocols were in place, including screening questions prior to the visit, additional usage of staff PPE, and extensive cleaning of exam room while observing appropriate contact time as indicated for  disinfecting solutions.

## 2023-04-25 NOTE — Assessment & Plan Note (Signed)
Remains on stable dose of oxycodone.  Will plan to continue this at current strength.  Updated pain contract completed and urine drug screen ordered.  PDMP reviewed.

## 2023-04-26 LAB — CBC WITH DIFFERENTIAL/PLATELET
Basophils Absolute: 0 10*3/uL (ref 0.0–0.2)
Basos: 1 %
EOS (ABSOLUTE): 0.2 10*3/uL (ref 0.0–0.4)
Eos: 4 %
Hematocrit: 46.1 % (ref 37.5–51.0)
Hemoglobin: 15.8 g/dL (ref 13.0–17.7)
Immature Grans (Abs): 0 10*3/uL (ref 0.0–0.1)
Immature Granulocytes: 0 %
Lymphocytes Absolute: 2 10*3/uL (ref 0.7–3.1)
Lymphs: 35 %
MCH: 31.2 pg (ref 26.6–33.0)
MCHC: 34.3 g/dL (ref 31.5–35.7)
MCV: 91 fL (ref 79–97)
Monocytes Absolute: 0.5 10*3/uL (ref 0.1–0.9)
Monocytes: 9 %
Neutrophils Absolute: 2.9 10*3/uL (ref 1.4–7.0)
Neutrophils: 51 %
Platelets: 241 10*3/uL (ref 150–450)
RBC: 5.06 x10E6/uL (ref 4.14–5.80)
RDW: 12 % (ref 11.6–15.4)
WBC: 5.7 10*3/uL (ref 3.4–10.8)

## 2023-04-26 LAB — CMP14+EGFR
ALT: 29 IU/L (ref 0–44)
AST: 24 IU/L (ref 0–40)
Albumin: 4.5 g/dL (ref 3.8–4.9)
Alkaline Phosphatase: 56 IU/L (ref 44–121)
BUN/Creatinine Ratio: 13 (ref 9–20)
BUN: 10 mg/dL (ref 6–24)
Bilirubin Total: 0.4 mg/dL (ref 0.0–1.2)
CO2: 21 mmol/L (ref 20–29)
Calcium: 9.7 mg/dL (ref 8.7–10.2)
Chloride: 99 mmol/L (ref 96–106)
Creatinine, Ser: 0.75 mg/dL — ABNORMAL LOW (ref 0.76–1.27)
Globulin, Total: 2.7 g/dL (ref 1.5–4.5)
Glucose: 98 mg/dL (ref 70–99)
Potassium: 4.6 mmol/L (ref 3.5–5.2)
Sodium: 135 mmol/L (ref 134–144)
Total Protein: 7.2 g/dL (ref 6.0–8.5)
eGFR: 107 mL/min/{1.73_m2} (ref >59)

## 2023-04-26 LAB — VITAMIN B12: Vitamin B-12: 445 pg/mL (ref 232–1245)

## 2023-04-26 LAB — LIPID PANEL WITH LDL/HDL RATIO
Cholesterol, Total: 220 mg/dL — ABNORMAL HIGH (ref 100–199)
HDL: 61 mg/dL (ref >39)
LDL Chol Calc (NIH): 138 mg/dL — ABNORMAL HIGH (ref 0–99)
LDL/HDL Ratio: 2.3 ratio (ref 0.0–3.6)
Triglycerides: 121 mg/dL (ref 0–149)
VLDL Cholesterol Cal: 21 mg/dL (ref 5–40)

## 2023-04-26 LAB — TSH: TSH: 0.05 u[IU]/mL — ABNORMAL LOW (ref 0.450–4.500)

## 2023-04-26 LAB — VITAMIN D 25 HYDROXY (VIT D DEFICIENCY, FRACTURES): Vit D, 25-Hydroxy: 18.2 ng/mL — ABNORMAL LOW (ref 30.0–100.0)

## 2023-04-26 LAB — PSA: Prostate Specific Ag, Serum: 1.5 ng/mL (ref 0.0–4.0)

## 2023-04-27 LAB — DRUG SCREEN, 5 PANEL, UR
Amphetamines, Urine: NEGATIVE ng/mL
Cannabinoid Quant, Ur: NEGATIVE ng/mL
Cocaine (Metab.): NEGATIVE ng/mL
OPIATE QUANTITATIVE URINE: NEGATIVE ng/mL
PCP Quant, Ur: NEGATIVE ng/mL

## 2023-04-27 LAB — SPECIMEN STATUS REPORT

## 2023-04-28 ENCOUNTER — Ambulatory Visit
Admission: EM | Admit: 2023-04-28 | Discharge: 2023-04-28 | Disposition: A | Discharge status: Home / Self Care | Attending: Family Medicine | Admitting: Family Medicine

## 2023-04-28 DIAGNOSIS — J029 Acute pharyngitis, unspecified: Secondary | ICD-10-CM | POA: Diagnosis not present

## 2023-04-28 LAB — POC COVID19/FLU A&B COMBO
Covid Antigen, POC: NEGATIVE
Influenza A Antigen, POC: NEGATIVE
Influenza B Antigen, POC: NEGATIVE

## 2023-04-28 NOTE — ED Provider Notes (Signed)
 [facility]    CSN: [id number] Arrival date & time: 04/28/23  1259      History   Chief Complaint Chief Complaint  Patient presents with   Sore Throat    HPI Reginald Salazar is a 53 y.o. male.    Sore Throat  Sore throat for 3 days worse in the morning, associated with mild nasal congestion.  Admits slight cough.  Admits recent travel to [place].  Denies fever, chills, sweats, headache, dizziness, rhinorrhea, swollen glands, difficulty swallowing, nausea, vomiting, diarrhea.  Denies known contacts with illness.  Had his annual well visit 19 2025.  Past medical history reviewed.  No known drug allergies.  Past Medical History:  Diagnosis Date   Hernia, abdominal 2008   Hyperlipidemia    Hypertension    Hypothyroidism    Sleep apnea    CPAP   Smoker    Thyroid disease     Patient Active Problem List   Diagnosis Date Noted   Well adult exam 04/25/2023   Impingement syndrome, shoulder, right 10/05/2022   Other fatigue 01/09/2022   Smoking greater than 20 pack years 01/09/2022   At high risk for cardiovascular disease 01/09/2022   Osteoarthritis 10/05/2020   Mitral valve disorder 10/05/2020   Depression 10/05/2020   Skin lesion 11/11/2019   Facial paresthesia 02/19/2019   Left hand pain 11/11/2018   Constipation 03/27/2018   Chronic bronchitis (HCC) 03/27/2018   Thyroid nodule 06/13/2017   Lumbar spondylosis 05/24/2017   Statin intolerance 03/20/2017   Left wrist pain 11/10/2016   Mixed hyperlipidemia 04/18/2016   Hypothyroidism 04/18/2016   Ventral hernia without obstruction or gangrene 04/18/2016   Sciatica 06/02/2013   Hashimoto's thyroiditis 02/26/2013   Hypertension 02/26/2013   OSA (obstructive sleep apnea) 02/26/2013    Past Surgical History:  Procedure Laterality Date   BACK SURGERY  2017       Home Medications    Prior to Admission medications   Medication Sig Start Date End Date Taking? Authorizing Provider  albuterol  (VENTOLIN HFA) 108 (90 Base) MCG/ACT inhaler Inhale 2 puffs into the lungs every 6 (six) hours as needed for wheezing or shortness of breath. 04/25/23   Everrett Coombe, DO  buPROPion (WELLBUTRIN XL) 150 MG 24 hr tablet Take 1 tablet (150 mg total) by mouth daily. 01/11/22   Everrett Coombe, DO  Cyanocobalamin 1000 MCG/ML KIT Inject 1,000 mcg as directed every 30 (thirty) days. 02/14/23   Everrett Coombe, DO  fluticasone-salmeterol [facility] INHUB) 250-50 MCG/ACT AEPB Inhale 1 puff into the lungs in the morning and at bedtime. 04/25/23   Everrett Coombe, DO  lisinopril (ZESTRIL) 20 MG tablet TAKE 1 TABLET DAILY 01/25/23   Everrett Coombe, DO  oxyCODONE-acetaminophen (PERCOCET/ROXICET) 5-325 MG tablet Take 1 tablet by mouth every 6 (six) hours as needed for severe pain (pain score 7-10). 03/27/23 04/26/23  Everrett Coombe, DO  rosuvastatin (CRESTOR) 20 MG tablet TAKE 1 TABLET DAILY 01/25/23   Everrett Coombe, DO  SYNTHROID 175 MCG tablet TAKE 1 TABLET DAILY BEFORE BREAKFAST 01/25/23   Everrett Coombe, DO    Family History Family History  Problem Relation Age of Onset   Rheum arthritis Mother    Mitral valve prolapse Mother    Stomach cancer Maternal Grandmother    Colon cancer Neg Hx    Colon polyps Neg Hx    Esophageal cancer Neg Hx    Rectal cancer Neg Hx     Social History Social History   Tobacco Use  Smoking status: Every Day    Current packs/day: 0.75    Average packs/day: 0.8 packs/day for 30.0 years (22.5 ttl pk-yrs)    Types: Cigarettes   Smokeless tobacco: Never  Vaping Use   Vaping status: Never Used  Substance Use Topics   Alcohol use: Yes    Alcohol/week: 12.0 standard drinks of alcohol    Types: 12 Cans of beer per week   Drug use: Not Currently     Allergies   Patient has no known allergies.   Review of Systems Review of Systems   Physical Exam Triage Vital Signs ED Triage Vitals  Encounter Vitals Group     BP 04/28/23 1305 (!) 146/78     Systolic BP Percentile --       Diastolic BP Percentile --      Pulse Rate 04/28/23 1305 99     Resp 04/28/23 1305 16     Temp 04/28/23 1305 98 F (36.7 C)     Temp Source 04/28/23 1305 Oral     SpO2 04/28/23 1305 96 %     Weight --      Height --      Head Circumference --      Peak Flow --      Pain Score 04/28/23 1306 3     Pain Loc --      Pain Education --      Exclude from Growth Chart --    No data found.  Updated Vital Signs BP (!) 146/78 (BP Location: Right Arm)   Pulse 99   Temp 98 F (36.7 C) (Oral)   Resp 16   SpO2 96%   Visual Acuity Right Eye Distance:   Left Eye Distance:   Bilateral Distance:    Right Eye Near:   Left Eye Near:    Bilateral Near:     Physical Exam Vitals and nursing note reviewed.  Constitutional:      Appearance: He is not ill-appearing.  HENT:     Head: Normocephalic and atraumatic.     Right Ear: Tympanic membrane and ear canal normal.     Left Ear: Tympanic membrane and ear canal normal.     Mouth/Throat:     Mouth: Mucous membranes are moist. No oral lesions.     Pharynx: Oropharynx is clear. Uvula midline. No pharyngeal swelling, oropharyngeal exudate, posterior oropharyngeal erythema or uvula swelling.     Tonsils: No tonsillar exudate or tonsillar abscesses.     Comments: Normal swallowing clear voice Cardiovascular:     Rate and Rhythm: Normal rate.     Heart sounds: Normal heart sounds.  Pulmonary:     Effort: Pulmonary effort is normal.     Breath sounds: Normal breath sounds. No wheezing.  Musculoskeletal:     Cervical back: Neck supple.  Lymphadenopathy:     Cervical: No cervical adenopathy.  Neurological:     Mental Status: He is alert.      UC Treatments / Results  Labs (all labs ordered are listed, but only abnormal results are displayed) Labs Reviewed  POC COVID19/FLU A&B COMBO    EKG   Radiology No results found.  Procedures Procedures (including critical care time)  Medications Ordered in UC Medications - No  data to display  Initial Impression / Assessment and Plan / UC Course  I have reviewed the triage vital signs and the nursing notes.  Pertinent labs & imaging results that were available during my care of the patient were  reviewed by me and considered in my medical decision making (see chart for details).     53 year old male with sore throat for 3 days, admits mild congestion and cough admits recent travel.  He is mildly hypertensive at 146/78 otherwise vital signs are stable temperature normal, Evidence of bacterial infection on exam, pharynx without erythema exudate tonsillar swelling, evidence of abscess change in voice no peritonsillar abscess or evidence of retropharyngeal abscess no trismus.  Point-of-care COVID and flu are negative, discussed with patient could be allergy related recommend he start his allergy medications, OTC meds for symptomatic relief follow-up as needed Final Clinical Impressions(s) / UC Diagnoses   Final diagnoses:  None   Discharge Instructions   None    ED Prescriptions   None    PDMP not reviewed this encounter.   Meliton Rattan, [place] 04/28/23 1350

## 2023-04-28 NOTE — ED Triage Notes (Addendum)
 Pt states sore throat for the past 4 days.  States it is worse in the morning when he wakes up and gets better as the day goes on.

## 2023-05-02 ENCOUNTER — Other Ambulatory Visit: Payer: Self-pay | Admitting: Family Medicine

## 2023-05-03 ENCOUNTER — Ambulatory Visit (HOSPITAL_BASED_OUTPATIENT_CLINIC_OR_DEPARTMENT_OTHER)
Admission: RE | Admit: 2023-05-03 | Discharge: 2023-05-03 | Disposition: A | Discharge status: Home / Self Care | Source: Ambulatory Visit | Attending: Family Medicine | Admitting: Family Medicine

## 2023-05-03 DIAGNOSIS — F17210 Nicotine dependence, cigarettes, uncomplicated: Secondary | ICD-10-CM | POA: Diagnosis present

## 2023-05-03 MED ORDER — OXYCODONE-ACETAMINOPHEN 5-325 MG PO TABS
1.0000 | ORAL_TABLET | Freq: Four times a day (QID) | ORAL | 0 refills | Status: DC | PRN
Start: 2023-05-03 — End: 2023-06-04

## 2023-05-04 ENCOUNTER — Other Ambulatory Visit: Payer: Self-pay | Admitting: Family Medicine

## 2023-05-04 ENCOUNTER — Encounter: Payer: Self-pay | Admitting: Family Medicine

## 2023-05-04 DIAGNOSIS — E063 Autoimmune thyroiditis: Secondary | ICD-10-CM

## 2023-05-04 MED ORDER — VITAMIN D (ERGOCALCIFEROL) 1.25 MG (50000 UNIT) PO CAPS: 50000.0000 [IU] | ORAL_CAPSULE | ORAL | 1 refills | Status: DC

## 2023-05-04 MED ORDER — LEVOTHYROXINE SODIUM 150 MCG PO TABS: 150.0000 ug | ORAL_TABLET | Freq: Every day | ORAL | 3 refills | Status: DC

## 2023-05-08 ENCOUNTER — Other Ambulatory Visit: Payer: Self-pay | Admitting: Family Medicine

## 2023-05-08 MED ORDER — ROSUVASTATIN CALCIUM 40 MG PO TABS: 40.0000 mg | ORAL_TABLET | Freq: Every day | ORAL | 1 refills | Status: DC

## 2023-05-15 ENCOUNTER — Encounter: Payer: Self-pay | Admitting: Family Medicine

## 2023-05-30 NOTE — Telephone Encounter (Signed)
 Called radiology reading room and changed CT to STAT

## 2023-05-30 NOTE — Telephone Encounter (Signed)
 CT has not resulted as of 05/03/23  Would you like me to call radiology reading room and request as STAT?

## 2023-06-01 ENCOUNTER — Encounter: Payer: Self-pay | Admitting: Family Medicine

## 2023-06-04 ENCOUNTER — Other Ambulatory Visit: Payer: Self-pay | Admitting: Family Medicine

## 2023-06-04 MED ORDER — OXYCODONE-ACETAMINOPHEN 5-325 MG PO TABS: 1.0000 | ORAL_TABLET | Freq: Four times a day (QID) | ORAL | 0 refills | Status: DC | PRN

## 2023-06-25 ENCOUNTER — Other Ambulatory Visit: Payer: Self-pay

## 2023-06-25 MED ORDER — ALBUTEROL SULFATE HFA 108 (90 BASE) MCG/ACT IN AERS: 2.0000 | INHALATION_SPRAY | Freq: Four times a day (QID) | RESPIRATORY_TRACT | 5 refills | Status: DC | PRN

## 2023-07-06 ENCOUNTER — Encounter: Payer: Self-pay | Admitting: Family Medicine

## 2023-07-09 MED ORDER — OXYCODONE-ACETAMINOPHEN 5-325 MG PO TABS: 1.0000 | ORAL_TABLET | Freq: Four times a day (QID) | ORAL | 0 refills | Status: DC | PRN

## 2023-07-24 ENCOUNTER — Encounter: Payer: Self-pay | Admitting: Family Medicine

## 2023-08-09 ENCOUNTER — Ambulatory Visit: Payer: Self-pay | Admitting: Family Medicine

## 2023-08-09 ENCOUNTER — Encounter: Payer: Self-pay | Admitting: Family Medicine

## 2023-08-09 LAB — TSH: TSH: 4.44 u[IU]/mL (ref 0.450–4.500)

## 2023-08-09 MED ORDER — OXYCODONE-ACETAMINOPHEN 5-325 MG PO TABS: 1.0000 | ORAL_TABLET | Freq: Four times a day (QID) | ORAL | 0 refills | Status: DC | PRN

## 2023-09-10 ENCOUNTER — Other Ambulatory Visit: Payer: Self-pay | Admitting: Family Medicine

## 2023-09-10 NOTE — Telephone Encounter (Unsigned)
 Copied from CRM [id number]. Topic: Clinical - Medication Refill >> Sep 10, 2023 12:31 PM Kevelyn M wrote: Medication: oxyCODONE -acetaminophen  (PERCOCET/ROXICET) 5-325 MG tablet, Patient has made an appointment for August 21st.  Has the patient contacted their pharmacy? No (Agent: If no, request that the patient contact the pharmacy for the refill. If patient does not wish to contact the pharmacy document the reason why and proceed with request.) (Agent: If yes, when and what did the pharmacy advise?)  This is the patient's preferred pharmacy:  [id number] [place] - [place], [place] - [address] [place] [place]. [place] [address] Phone: [phone] Fax: [phone]  Is this the correct pharmacy for this prescription? Yes If no, delete pharmacy and type the correct one.   Has the prescription been filled recently? No  Is the patient out of the medication? No  Has the patient been seen for an appointment in the last year OR does the patient have an upcoming appointment? Yes  Can we respond through MyChart? Yes  Agent: Please be advised that Rx refills may take up to 3 business days. We ask that you follow-up with your pharmacy.

## 2023-09-11 ENCOUNTER — Encounter: Payer: Self-pay | Admitting: Family Medicine

## 2023-09-11 MED ORDER — OXYCODONE-ACETAMINOPHEN 5-325 MG PO TABS: 1.0000 | ORAL_TABLET | Freq: Four times a day (QID) | ORAL | 0 refills | Status: DC | PRN

## 2023-09-11 NOTE — Telephone Encounter (Signed)
 Patient requesting rx rf of oxycodone  / acet 5-325  Last written 08/09/2023 Last OV 04/25/2023 Upcoming appt 09/27/2023

## 2023-09-27 ENCOUNTER — Ambulatory Visit: Admitting: Family Medicine

## 2023-09-27 ENCOUNTER — Encounter: Payer: Self-pay | Admitting: Family Medicine

## 2023-09-27 VITALS — BP 156/81 | HR 79 | Ht 68.0 in | Wt 228.0 lb

## 2023-09-27 DIAGNOSIS — I10 Essential (primary) hypertension: Secondary | ICD-10-CM | POA: Diagnosis not present

## 2023-09-27 DIAGNOSIS — Z1211 Encounter for screening for malignant neoplasm of colon: Secondary | ICD-10-CM | POA: Diagnosis not present

## 2023-09-27 DIAGNOSIS — I341 Nonrheumatic mitral (valve) prolapse: Secondary | ICD-10-CM | POA: Insufficient documentation

## 2023-09-27 DIAGNOSIS — E782 Mixed hyperlipidemia: Secondary | ICD-10-CM | POA: Diagnosis not present

## 2023-09-27 DIAGNOSIS — J42 Unspecified chronic bronchitis: Secondary | ICD-10-CM | POA: Diagnosis not present

## 2023-09-27 DIAGNOSIS — M47816 Spondylosis without myelopathy or radiculopathy, lumbar region: Secondary | ICD-10-CM

## 2023-09-27 DIAGNOSIS — E063 Autoimmune thyroiditis: Secondary | ICD-10-CM

## 2023-09-27 MED ORDER — ROSUVASTATIN CALCIUM 40 MG PO TABS: 40.0000 mg | ORAL_TABLET | Freq: Every day | ORAL | 1 refills | Status: AC

## 2023-09-27 MED ORDER — FLUTICASONE-SALMETEROL 250-50 MCG/ACT IN AEPB: 1.0000 | INHALATION_SPRAY | Freq: Two times a day (BID) | RESPIRATORY_TRACT | 3 refills | Status: AC

## 2023-09-27 MED ORDER — LISINOPRIL 20 MG PO TABS: 20.0000 mg | ORAL_TABLET | Freq: Every day | ORAL | 1 refills | Status: AC

## 2023-09-27 MED ORDER — LEVOTHYROXINE SODIUM 150 MCG PO TABS: 150.0000 ug | ORAL_TABLET | Freq: Every day | ORAL | 3 refills | Status: AC

## 2023-09-27 NOTE — Assessment & Plan Note (Signed)
 Remains on stable dose of oxycodone .  Will plan to continue this at current strength.   PDMP reviewed.

## 2023-09-27 NOTE — Progress Notes (Signed)
 Offered pt 2 week NV due elevated BP. Pt declined scheduling. Requested to have RN girlfriend check BP at home and forward readings to Dr. Alvia via MyChart.

## 2023-09-27 NOTE — Assessment & Plan Note (Signed)
 He has been taking atorvastatin  more consistently at this point.  Will plan to continue this.

## 2023-09-27 NOTE — Assessment & Plan Note (Signed)
 Blood pressure is elevated.  He is not taking medication consistently.  Encouraged to take this on a daily basis.  Return in 2 weeks for blood pressure recheck.SABRA

## 2023-09-27 NOTE — Assessment & Plan Note (Signed)
 Feels good with current strength of levothyroxine .  Will plan to continue

## 2023-09-27 NOTE — Progress Notes (Signed)
 [facility]inald [facility]azar - 53 y.o. male MRN [id number]  Date of birth: Nov 12, 1970  Subjective Chief Complaint  Patient presents with   Leg Pain   Back Pain    HPI Reginald Salazar is a 53 y.o. male here today for follow up visit.   He reports that he is  having increased claudication symptoms when walking for more than 5-10 minutes.  He has numbness and tingling associated with this.  Denies weakness in his extremities.  Improved with leaning over.  Does not have cramping sensation.  Remains on chronic opioid therapy with Percocet 5/325 mg as needed.  Tolerating this well without side effects.  He has not been taking lisinopril  consistently.  Blood pressure is elevated today.  Denies symptoms related to hypertension including chest pain, shortness of breath, headaches or vision changes.  He is taking thyroid  medication consistently.  ROS:  A comprehensive ROS was completed and negative except as noted per HPI    No Known Allergies  Past Medical History:  Diagnosis Date   Hernia, abdominal 2008   Hyperlipidemia    Hypertension    Hypothyroidism    Sleep apnea    CPAP   Smoker    Thyroid  disease     Past Surgical History:  Procedure Laterality Date   BACK SURGERY  2017    Social History   Socioeconomic History   Marital status: Widowed    Spouse name: Not on file   Number of children: Not on file   Years of education: Not on file   Highest education level: Some college, no degree  Occupational History   Occupation: Retired  Tobacco Use   Smoking status: Every Day    Current packs/day: 0.75    Average packs/day: 0.8 packs/day for 30.0 years (22.5 ttl pk-yrs)    Types: Cigarettes   Smokeless tobacco: Never  Vaping Use   Vaping status: Never Used  Substance and Sexual Activity   Alcohol use: Yes    Alcohol/week: 12.0 standard drinks of alcohol    Types: 12 Cans of beer per week   Drug use: Not Currently   Sexual activity: Yes    Partners: Female  Other Topics  Concern   Not on file  Social History Narrative   Not on file   Social Drivers of Health   Financial Resource Strain: Low Risk  (10/04/2022)   Overall Financial Resource Strain (CARDIA)    Difficulty of Paying Living Expenses: Not hard at all  Food Insecurity: No Food Insecurity (10/04/2022)   Hunger Vital Sign    Worried About Running Out of Food in the Last Year: Never true    Ran Out of Food in the Last Year: Never true  Transportation Needs: No Transportation Needs (10/04/2022)   PRAPARE - Administrator, Civil Service (Medical): No    Lack of Transportation (Non-Medical): No  Physical Activity: Sufficiently Active (10/04/2022)   Exercise Vital Sign    Days of Exercise per Week: 2 days    Minutes of Exercise per Session: 90 min  Stress: No Stress Concern Present (10/04/2022)   [employer] of Occupational Health - Occupational Stress Questionnaire    Feeling of Stress : Not at all  Social Connections: Moderately Isolated (10/04/2022)   Social Connection and Isolation Panel    Frequency of Communication with Friends and Family: More than three times a week    Frequency of Social Gatherings with Friends and Family: Twice a week    Attends Religious Services:  Never    Active Member of Clubs or Organizations: Yes    Attends Banker Meetings: Never    Marital Status: Widowed    Family History  Problem Relation Age of Onset   Rheum arthritis Mother    Mitral valve prolapse Mother    Stomach cancer Maternal Grandmother    Colon cancer Neg Hx    Colon polyps Neg Hx    Esophageal cancer Neg Hx    Rectal cancer Neg Hx     Health Maintenance  Topic Date Due   HIV Screening  Never done   Hepatitis C Screening  Never done   Colonoscopy  11/19/2021   INFLUENZA VACCINE  09/07/2023   COVID-19 Vaccine (3 - Pfizer risk series) 10/11/2023 (Originally 06/16/2019)   Lung Cancer Screening  05/02/2024   DTaP/Tdap/Td (5 - Td or Tdap) 03/21/2027   Pneumococcal  Vaccine: 50+ Years  Completed   Hepatitis B Vaccines 19-59 Average Risk  Completed   Zoster Vaccines- Shingrix  Completed   HPV VACCINES  Aged Out   Meningococcal B Vaccine  Aged Out     ----------------------------------------------------------------------------------------------------------------------------------------------------------------------------------------------------------------- Physical Exam BP (!) 156/81   Pulse 79   Ht 5' 8 (1.727 m)   Wt 228 lb (103.4 kg)   SpO2 98%   BMI 34.67 kg/m   Physical Exam Constitutional:      Appearance: Normal appearance.  Cardiovascular:     Rate and Rhythm: Normal rate and regular rhythm.  Pulmonary:     Effort: Pulmonary effort is normal.     Breath sounds: Normal breath sounds.  Neurological:     General: No focal deficit present.     Mental Status: He is alert.  Psychiatric:        Mood and Affect: Mood normal.        Behavior: Behavior normal.     ------------------------------------------------------------------------------------------------------------------------------------------------------------------------------------------------------------------- Assessment and Plan  Hypertension Blood pressure is elevated.  He is not taking medication consistently.  Encouraged to take this on a daily basis.  Return in 2 weeks for blood pressure recheck..  Mixed hyperlipidemia He has been taking atorvastatin  more consistently at this point.  Will plan to continue this.  Hypothyroidism Feels good with current strength of levothyroxine .  Will plan to continue  Lumbar spondylosis Remains on stable dose of oxycodone .  Will plan to continue this at current strength.   PDMP reviewed.   Meds ordered this encounter  Medications   fluticasone -salmeterol (WIXELA INHUB) 250-50 MCG/ACT AEPB    Sig: Inhale 1 puff into the lungs in the morning and at bedtime.    Dispense:  60 each    Refill:  3   levothyroxine  (SYNTHROID ) 150  MCG tablet    Sig: Take 1 tablet (150 mcg total) by mouth daily.    Dispense:  90 tablet    Refill:  3   lisinopril  (ZESTRIL ) 20 MG tablet    Sig: Take 1 tablet (20 mg total) by mouth daily.    Dispense:  90 tablet    Refill:  1   rosuvastatin  (CRESTOR ) 40 MG tablet    Sig: Take 1 tablet (40 mg total) by mouth daily.    Dispense:  90 tablet    Refill:  1    No follow-ups on file.

## 2023-10-03 ENCOUNTER — Other Ambulatory Visit: Payer: Self-pay | Admitting: Family Medicine

## 2023-10-09 ENCOUNTER — Encounter: Payer: Self-pay | Admitting: Sports Medicine

## 2023-10-15 ENCOUNTER — Encounter: Payer: Self-pay | Admitting: Family Medicine

## 2023-10-15 MED ORDER — OXYCODONE-ACETAMINOPHEN 5-325 MG PO TABS: 1.0000 | ORAL_TABLET | Freq: Four times a day (QID) | ORAL | 0 refills | Status: DC | PRN

## 2023-11-14 ENCOUNTER — Encounter: Payer: Self-pay | Admitting: Family Medicine

## 2023-11-15 MED ORDER — OXYCODONE-ACETAMINOPHEN 5-325 MG PO TABS: 1.0000 | ORAL_TABLET | Freq: Four times a day (QID) | ORAL | 0 refills | Status: DC | PRN

## 2023-11-15 NOTE — Telephone Encounter (Signed)
 Patient requesting rx rf of  Oxycodone  - acet  5/325 to [facility] [place] Last written 09/08/225 Last OV 09/27/2023 Upcoming appt = none

## 2023-12-12 ENCOUNTER — Encounter: Payer: Self-pay | Admitting: Gastroenterology

## 2023-12-19 ENCOUNTER — Encounter: Payer: Self-pay | Admitting: Family Medicine

## 2023-12-19 MED ORDER — OXYCODONE-ACETAMINOPHEN 5-325 MG PO TABS: 1.0000 | ORAL_TABLET | Freq: Four times a day (QID) | ORAL | 0 refills | Status: DC | PRN

## 2024-01-04 ENCOUNTER — Other Ambulatory Visit: Payer: Self-pay | Admitting: Family Medicine

## 2024-01-11 ENCOUNTER — Ambulatory Visit: Admitting: *Deleted

## 2024-01-11 VITALS — Ht 69.0 in | Wt 225.0 lb

## 2024-01-11 DIAGNOSIS — Z860100 Personal history of colon polyps, unspecified: Secondary | ICD-10-CM

## 2024-01-11 MED ORDER — NA SULFATE-K SULFATE-MG SULF 17.5-3.13-1.6 GM/177ML PO SOLN: 1.0000 | Freq: Once | ORAL | 0 refills | Status: AC

## 2024-01-11 NOTE — Progress Notes (Signed)
 Pt's name and DOB verified at the beginning of the pre-visit with 2 identifiers  Pt denies any difficulty with ambulating,sitting, laying down or rolling side to side  Pt has no issues moving head neck or swallowing  No egg or soy allergy known to patient   No issues known to pt with past sedation  No FH of Malignant Hyperthermia  Pt is not on home 02   Pt is not on blood thinners   Pt has frequent issues with constipation RN instructed pt to use Miralax per bottles instructions a week before prep days. Pt states they will  Pt is not on dialysis  Pt denise any abnormal heart rhythms   Pt denies any upcoming cardiac testing  Patient's chart reviewed by Norleen Schillings CNRA prior to pre-visit and patient appropriate for the [facility].  Pre-visit completed and red dot placed by patient's name on their procedure day (on provider's schedule).    Visit by phone  Pt states weight is 228 lb   Pt given  both [facility] main # and MD on call # prior to instructions.  Informed pt to come in at the time discussed and is shown on PV instructions.  Pt instructed to use Singlecare.com or GoodRx for a price reduction on prep  Instructed pt where to find PV instructions in My Chart.  Instructed pt on all aspects of written instructions including med holds clothing to wear and foods to eat and not eat as well as after procedure legal restrictions and to call MD on call if needed.. Pt states understanding. Instructed pt to review instructions again prior to procedure and call main # given if has any questions or any issues. Pt states they will.

## 2024-01-18 ENCOUNTER — Telehealth: Payer: Self-pay | Admitting: Gastroenterology

## 2024-01-18 NOTE — Telephone Encounter (Signed)
 PT is calling to find out about the procedure costs. Please advise.

## 2024-01-23 ENCOUNTER — Encounter: Payer: Self-pay | Admitting: Gastroenterology

## 2024-01-25 ENCOUNTER — Encounter: Payer: Self-pay | Admitting: Gastroenterology

## 2024-01-25 ENCOUNTER — Ambulatory Visit (AMBULATORY_SURGERY_CENTER): Admitting: Gastroenterology

## 2024-01-25 VITALS — BP 136/80 | HR 65 | Temp 97.5°F | Resp 13 | Ht 69.0 in | Wt 225.0 lb

## 2024-01-25 DIAGNOSIS — Z1211 Encounter for screening for malignant neoplasm of colon: Secondary | ICD-10-CM

## 2024-01-25 DIAGNOSIS — K635 Polyp of colon: Secondary | ICD-10-CM | POA: Diagnosis not present

## 2024-01-25 DIAGNOSIS — K641 Second degree hemorrhoids: Secondary | ICD-10-CM | POA: Diagnosis not present

## 2024-01-25 DIAGNOSIS — K5730 Diverticulosis of large intestine without perforation or abscess without bleeding: Secondary | ICD-10-CM | POA: Diagnosis not present

## 2024-01-25 DIAGNOSIS — D122 Benign neoplasm of ascending colon: Secondary | ICD-10-CM

## 2024-01-25 DIAGNOSIS — D125 Benign neoplasm of sigmoid colon: Secondary | ICD-10-CM

## 2024-01-25 DIAGNOSIS — Z860101 Personal history of adenomatous and serrated colon polyps: Secondary | ICD-10-CM

## 2024-01-25 DIAGNOSIS — D124 Benign neoplasm of descending colon: Secondary | ICD-10-CM

## 2024-01-25 DIAGNOSIS — Z860100 Personal history of colon polyps, unspecified: Secondary | ICD-10-CM

## 2024-01-25 MED ORDER — SODIUM CHLORIDE 0.9 % IV SOLN: 500.0000 mL | Freq: Once | INTRAVENOUS | Status: DC

## 2024-01-25 NOTE — Op Note (Signed)
 [facility] Patient Name: Reginald Salazar Procedure Date: 01/25/2024 8:47 AM MRN: [id number] Endoscopist: Sandor Flatter , MD, [id number] Age: 53 Referring MD:  Date of Birth: 07/29/1970 Gender: Male Account #: [id number] Procedure:                Colonoscopy Indications:              High risk colon cancer surveillance: Personal                            history of sessile serrated colon polyp (less than                            10 mm in size) with no dysplasia                           Last colonoscopy was 11/2018 and notable for 3                            small 3-4 mm sessile serrated polyps,                            pandiverticulosis. Medicines:                Monitored Anesthesia Care Procedure:                Pre-Anesthesia Assessment:                           - Prior to the procedure, a History and Physical                            was performed, and patient medications and                            allergies were reviewed. The patient's tolerance of                            previous anesthesia was also reviewed. The risks                            and benefits of the procedure and the sedation                            options and risks were discussed with the patient.                            All questions were answered, and informed consent                            was obtained. Prior Anticoagulants: The patient has                            taken no anticoagulant or antiplatelet agents. ASA  Grade Assessment: III - A patient with severe                            systemic disease. After reviewing the risks and                            benefits, the patient was deemed in satisfactory                            condition to undergo the procedure.                           After obtaining informed consent, the colonoscope                            was passed under direct vision. Throughout the                             procedure, the patient's blood pressure, pulse, and                            oxygen saturations were monitored continuously. The                            CF [id number] #[id number] was introduced through the anus                            and advanced to the the cecum, identified by                            appendiceal orifice and ileocecal valve. The                            colonoscopy was performed without difficulty. The                            patient tolerated the procedure well. The quality                            of the bowel preparation was good. The ileocecal                            valve, appendiceal orifice, and rectum were                            photographed. Scope In: 8:53:44 AM Scope Out: 9:13:10 AM Scope Withdrawal Time: 0 hours 17 minutes 36 seconds  Total Procedure Duration: 0 hours 19 minutes 26 seconds  Findings:                 The perianal and digital rectal examinations were                            normal.  Two sessile polyps were found in the descending                            colon and ascending colon. The polyps were 3 to 4                            mm in size. These polyps were removed with a cold                            snare. Resection and retrieval were complete.                            Estimated blood loss was minimal.                           A 2 mm polyp was found in the sigmoid colon. The                            polyp was sessile. The polyp was removed with a                            cold snare. Resection and retrieval were complete.                            Estimated blood loss was minimal.                           Multiple small-mouthed diverticula were found in                            the entire colon.                           Non-bleeding internal hemorrhoids were found during                            retroflexion. The hemorrhoids were small and Grade                             II (internal hemorrhoids that prolapse but reduce                            spontaneously). Complications:            No immediate complications. Estimated Blood Loss:     Estimated blood loss was minimal. Impression:               - Two 3 to 4 mm polyps in the descending colon and                            in the ascending colon, removed with a cold snare.                            Resected and retrieved.                           -  One 2 mm polyp in the sigmoid colon, removed with                            a cold snare. Resected and retrieved.                           - Diverticulosis in the entire examined colon.                           - Non-bleeding internal hemorrhoids. Recommendation:           - Patient has a contact number available for                            emergencies. The signs and symptoms of potential                            delayed complications were discussed with the                            patient. Return to normal activities tomorrow.                            Written discharge instructions were provided to the                            patient.                           - Resume previous diet.                           - Continue present medications.                           - Await pathology results.                           - Repeat colonoscopy for surveillance based on                            pathology results.                           - Return to GI clinic PRN.                           - Internal hemorrhoids were noted on this study and                            may be amenable to hemorrhoid band ligation. If you                            are interested in further treatment of these  hemorrhoids with band ligation, please contact my                            clinic to set up an appointment for evaluation and                            treatment.                           - Use fiber, for example Citrucel,  Fibercon, Konsyl                            or Metamucil. Sandor Flatter, MD 01/25/2024 9:18:22 AM

## 2024-01-25 NOTE — Progress Notes (Signed)
 Called to room to assist during endoscopic procedure.  Patient ID and intended procedure confirmed with present staff. Received instructions for my participation in the procedure from the performing physician.

## 2024-01-25 NOTE — Progress Notes (Signed)
 Pt's states no medical or surgical changes since previsit or office visit.

## 2024-01-25 NOTE — Progress Notes (Signed)
 "   GASTROENTEROLOGY PROCEDURE H&P NOTE   Primary Care Physician: Alvia Bring, DO    Reason for Procedure:  Colon polyp surveillance  Plan:    Colonoscopy  Patient is appropriate for endoscopic procedure(s) in the ambulatory ([facility]) setting.  The nature of the procedure, as well as the risks, benefits, and alternatives were carefully and thoroughly reviewed with the patient. Ample time for discussion and questions allowed. The patient understood, was satisfied, and agreed to proceed. I personally addressed all patient questions and concerns.     HPI: Reginald Salazar is a 53 y.o. male who presents for colonoscopy for ongoing colon polyp surveillance and colon cancer screening.  No active GI symptoms.  No known family history of colon cancer or related malignancy.  Patient is otherwise without complaints or active issues today.  Last colonoscopy was 11/2018 and notable for 3 small 3-4 mm sessile serrated polyps, pandiverticulosis.  Past Medical History:  Diagnosis Date   Asthma    Constipation    with narcotics   Depression    Fatigue    Heart murmur    Hernia, abdominal 2008   Hyperlipidemia    Hypertension    Hypothyroidism    Neuromuscular disorder (HCC)    Prolapsing mitral leaflet syndrome    Sleep apnea    CPAP   Smoker    Thyroid  disease     Past Surgical History:  Procedure Laterality Date   BACK SURGERY  2017   COLONOSCOPY      Prior to Admission medications  Medication Sig Start Date End Date Taking? Authorizing Provider  albuterol  (VENTOLIN  HFA) 108 (90 Base) MCG/ACT inhaler USE 2 INHALATIONS EVERY 6 HOURS AS NEEDED FOR WHEEZING OR SHORTNESS OF BREATH 01/07/24  Yes Alvia Bring, DO  Cyanocobalamin  1000 MCG/ML KIT Inject 1,000 mcg as directed every 30 (thirty) days. 02/14/23  Yes Alvia Bring, DO  fluticasone -salmeterol (WIXELA INHUB) 250-50 MCG/ACT AEPB Inhale 1 puff into the lungs in the morning and at bedtime. 09/27/23  Yes Alvia Bring, DO   levothyroxine  (SYNTHROID ) 150 MCG tablet Take 1 tablet (150 mcg total) by mouth daily. 09/27/23  Yes Alvia Bring, DO  lisinopril  (ZESTRIL ) 20 MG tablet Take 1 tablet (20 mg total) by mouth daily. 09/27/23  Yes Alvia Bring, DO  oxyCODONE -acetaminophen  (PERCOCET/ROXICET) 5-325 MG tablet Take 1 tablet by mouth every 6 (six) hours as needed for severe pain (pain score 7-10). 12/19/23 01/25/24 Yes Alvia Bring, DO  rosuvastatin  (CRESTOR ) 40 MG tablet Take 1 tablet (40 mg total) by mouth daily. 09/27/23  Yes Alvia Bring, DO  Vitamin D , Ergocalciferol , (DRISDOL ) 1.25 MG (50000 UNIT) CAPS capsule TAKE 1 CAPSULE EVERY 7 DAYS 10/03/23  Yes Alvia Bring, DO    Current Outpatient Medications  Medication Sig Dispense Refill   albuterol  (VENTOLIN  HFA) 108 (90 Base) MCG/ACT inhaler USE 2 INHALATIONS EVERY 6 HOURS AS NEEDED FOR WHEEZING OR SHORTNESS OF BREATH 18 g 2   Cyanocobalamin  1000 MCG/ML KIT Inject 1,000 mcg as directed every 30 (thirty) days. 1 kit 11   fluticasone -salmeterol (WIXELA INHUB) 250-50 MCG/ACT AEPB Inhale 1 puff into the lungs in the morning and at bedtime. 60 each 3   levothyroxine  (SYNTHROID ) 150 MCG tablet Take 1 tablet (150 mcg total) by mouth daily. 90 tablet 3   lisinopril  (ZESTRIL ) 20 MG tablet Take 1 tablet (20 mg total) by mouth daily. 90 tablet 1   oxyCODONE -acetaminophen  (PERCOCET/ROXICET) 5-325 MG tablet Take 1 tablet by mouth every 6 (six) hours as needed for severe  pain (pain score 7-10). 120 tablet 0   rosuvastatin  (CRESTOR ) 40 MG tablet Take 1 tablet (40 mg total) by mouth daily. 90 tablet 1   Vitamin D , Ergocalciferol , (DRISDOL ) 1.25 MG (50000 UNIT) CAPS capsule TAKE 1 CAPSULE EVERY 7 DAYS 12 capsule 3   Current Facility-Administered Medications  Medication Dose Route Frequency Provider Last Rate Last Admin   0.9 %  sodium chloride  infusion  500 mL Intravenous Once Oneka Parada V, DO        Allergies as of 01/25/2024   (No Known Allergies)    Family  History  Problem Relation Age of Onset   Rheum arthritis Mother    Mitral valve prolapse Mother    Stomach cancer Maternal Grandmother    Colon cancer Neg Hx    Colon polyps Neg Hx    Esophageal cancer Neg Hx    Rectal cancer Neg Hx     Social History   Socioeconomic History   Marital status: Widowed    Spouse name: Not on file   Number of children: Not on file   Years of education: Not on file   Highest education level: Some college, no degree  Occupational History   Occupation: Retired  Tobacco Use   Smoking status: Every Day    Current packs/day: 0.75    Average packs/day: 0.8 packs/day for 30.0 years (22.5 ttl pk-yrs)    Types: Cigarettes   Smokeless tobacco: Never  Vaping Use   Vaping status: Never Used  Substance and Sexual Activity   Alcohol use: Yes    Alcohol/week: 12.0 standard drinks of alcohol    Types: 12 Cans of beer per week   Drug use: Not Currently   Sexual activity: Yes    Partners: Female  Other Topics Concern   Not on file  Social History Narrative   Not on file   Social Drivers of Health   Tobacco Use: High Risk (01/25/2024)   Patient History    Smoking Tobacco Use: Every Day    Smokeless Tobacco Use: Never    Passive Exposure: Not on file  Financial Resource Strain: Low Risk (10/04/2022)   Overall Financial Resource Strain (CARDIA)    Difficulty of Paying Living Expenses: Not hard at all  Food Insecurity: No Food Insecurity (10/04/2022)   Hunger Vital Sign    Worried About Running Out of Food in the Last Year: Never true    Ran Out of Food in the Last Year: Never true  Transportation Needs: No Transportation Needs (10/04/2022)   PRAPARE - Administrator, Civil Service (Medical): No    Lack of Transportation (Non-Medical): No  Physical Activity: Sufficiently Active (10/04/2022)   Exercise Vital Sign    Days of Exercise per Week: 2 days    Minutes of Exercise per Session: 90 min  Stress: No Stress Concern Present (10/04/2022)    [employer] of Occupational Health - Occupational Stress Questionnaire    Feeling of Stress : Not at all  Social Connections: Moderately Isolated (10/04/2022)   Social Connection and Isolation Panel    Frequency of Communication with Friends and Family: More than three times a week    Frequency of Social Gatherings with Friends and Family: Twice a week    Attends Religious Services: Never    Database Administrator or Organizations: Yes    Attends Banker Meetings: Never    Marital Status: Widowed  Intimate Partner Violence: Not on file  Depression [phone]): Low Risk (  09/27/2023)   Depression (PHQ2-9)    PHQ-2 Score: 0  Alcohol Screen: Low Risk (10/04/2022)   Alcohol Screen    Last Alcohol Screening Score (AUDIT): 6  Housing: Not on file  Utilities: Not on file  Health Literacy: Not on file    Physical Exam: Vital signs in last 24 hours: @BP  (!) 146/82   Pulse 73   Temp (!) 97.5 F (36.4 C) (Skin)   Ht 5' 9 (1.753 m)   Wt 225 lb (102.1 kg)   SpO2 96%   BMI 33.23 kg/m  GEN: NAD EYE: Sclerae anicteric ENT: MMM CV: Non-tachycardic Pulm: CTA b/l GI: Soft, NT/ND NEURO:  Alert & Oriented x 3   Sandor Flatter, DO [facility]   01/25/2024 8:45 AM  "

## 2024-01-25 NOTE — Patient Instructions (Signed)
 Resume previous diet Continue present medications Use fiber, ie: Citrucel, fibercon, etc, OTC  Await pathology results  Handouts/information given for fiber supplement, high fiber diet, colon polyps, diverticulosis and hemorrhoids  YOU HAD AN ENDOSCOPIC PROCEDURE TODAY AT THE [facility]:   Refer to the procedure report that was given to you for any specific questions about what was found during the examination.  If the procedure report does not answer your questions, please call your gastroenterologist to clarify.  If you requested that your care partner not be given the details of your procedure findings, then the procedure report has been included in a sealed envelope for you to review at your convenience later.  YOU SHOULD EXPECT: Some feelings of bloating in the abdomen. Passage of more gas than usual.  Walking can help get rid of the air that was put into your GI tract during the procedure and reduce the bloating. If you had a lower endoscopy (such as a colonoscopy or flexible sigmoidoscopy) you may notice spotting of blood in your stool or on the toilet paper. If you underwent a bowel prep for your procedure, you may not have a normal bowel movement for a few days.  Please Note:  You might notice some irritation and congestion in your nose or some drainage.  This is from the oxygen used during your procedure.  There is no need for concern and it should clear up in a day or so.  SYMPTOMS TO REPORT IMMEDIATELY:  Following lower endoscopy (colonoscopy):  Excessive amounts of blood in the stool  Significant tenderness or worsening of abdominal pains  Swelling of the abdomen that is new, acute  Fever of 100F or higher For urgent or emergent issues, a gastroenterologist can be reached at any hour by calling [phone]. Do not use MyChart messaging for urgent concerns.    DIET:  We do recommend a small meal at first, but then you may proceed to your regular diet.  Drink  plenty of fluids but you should avoid alcoholic beverages for 24 hours.  ACTIVITY:  You should plan to take it easy for the rest of today and you should NOT DRIVE or use heavy machinery until tomorrow (because of the sedation medicines used during the test).    FOLLOW UP: Our staff will call the number listed on your records the next business day following your procedure.  We will call around 7:15- 8:00 am to check on you and address any questions or concerns that you may have regarding the information given to you following your procedure. If we do not reach you, we will leave a message.     If any biopsies were taken you will be contacted by phone or by letter within the next 1-3 weeks.  Please call us  at [phone] if you have not heard about the biopsies in 3 weeks.    SIGNATURES/CONFIDENTIALITY: You and/or your care partner have signed paperwork which will be entered into your electronic medical record.  These signatures attest to the fact that that the information above on your After Visit Summary has been reviewed and is understood.  Full responsibility of the confidentiality of this discharge information lies with you and/or your care-partner.

## 2024-01-25 NOTE — Progress Notes (Signed)
 Vss nad trans to pacu

## 2024-01-28 ENCOUNTER — Encounter: Payer: Self-pay | Admitting: Family Medicine

## 2024-01-28 ENCOUNTER — Telehealth: Payer: Self-pay

## 2024-01-28 MED ORDER — OXYCODONE-ACETAMINOPHEN 5-325 MG PO TABS: 1.0000 | ORAL_TABLET | Freq: Four times a day (QID) | ORAL | 0 refills | Status: DC | PRN

## 2024-01-28 NOTE — Telephone Encounter (Signed)
 Post procedure follow up call, no answer

## 2024-01-30 LAB — SURGICAL PATHOLOGY

## 2024-02-06 ENCOUNTER — Ambulatory Visit: Payer: Self-pay | Admitting: Gastroenterology

## 2024-02-29 ENCOUNTER — Telehealth: Payer: Self-pay | Admitting: Family Medicine

## 2024-02-29 NOTE — Telephone Encounter (Signed)
 Pt is requesting refill on  oxyCODONE -acetaminophen  (PERCOCET/ROXICET) 5-325 MG tablet . Pt would like Rx sent to   [facility] [address] - [place], [place] - [address] [place]. AT [address] [facility] [address] Phone: [phone]  Fax: [phone]     [phone]

## 2024-03-03 MED ORDER — OXYCODONE-ACETAMINOPHEN 5-325 MG PO TABS: 1.0000 | ORAL_TABLET | Freq: Four times a day (QID) | ORAL | 0 refills | Status: AC | PRN

## 2024-03-03 NOTE — Telephone Encounter (Signed)
Left message advising patient of medication.

## 2024-03-03 NOTE — Telephone Encounter (Signed)
 Attempted to contact the patient, no answer. Left a brief vm msg that pain medication was sent to [facility] pharmacy. Direct call back information provided.

## 2024-03-04 ENCOUNTER — Ambulatory Visit: Admitting: Family Medicine

## 2024-03-04 ENCOUNTER — Encounter: Payer: Self-pay | Admitting: Family Medicine

## 2024-03-04 VITALS — Ht 69.0 in | Wt 225.0 lb

## 2024-03-04 DIAGNOSIS — M47816 Spondylosis without myelopathy or radiculopathy, lumbar region: Secondary | ICD-10-CM | POA: Diagnosis not present

## 2024-03-04 MED ORDER — PREDNISONE 10 MG (21) PO TBPK: ORAL_TABLET | ORAL | 0 refills | Status: AC

## 2024-03-04 NOTE — Progress Notes (Signed)
 Patient states appt for Percocet refill. Scheduled for physical next month. Will discuss shoulder pain as well.

## 2024-03-04 NOTE — Progress Notes (Signed)
 " Reginald Salazar - 54 y.o. male MRN [id number]  Date of birth: 1970/03/29   All issues noted in this document were discussed and addressed.  No physical exam was performed (except for noted visual exam findings with Video Visits).  I discussed the limitations of evaluation and management by telemedicine and the availability of in person appointments. The patient expressed understanding and agreed to proceed.  I connected withNAME@ on 03/04/24 at  1:50 PM EST by a video enabled telemedicine application and verified that I am speaking with the correct person using two identifiers.  Present at visit: Velma Ku, DO Lonni Funk   Patient Location: Home  [address] [place] [address]   Provider location:   [facility]  Chief Complaint  Patient presents with   Medication Refill    HPI  Reginald Salazar is a 54 y.o. male who presents via audio/video conferencing for a telehealth visit today.  Following up today for chronic low back pain.  He is having some increased pain with radiation into the left buttock and leg.  He remains on chronic Percocet which has continued to work well for him.  No side effects from this current strength.     ROS:  A comprehensive ROS was completed and negative except as noted per HPI  Past Medical History:  Diagnosis Date   Asthma    Constipation    with narcotics   Depression    Fatigue    Heart murmur    Hernia, abdominal 2008   Hyperlipidemia    Hypertension    Hypothyroidism    Neuromuscular disorder (HCC)    Prolapsing mitral leaflet syndrome    Sleep apnea    CPAP   Smoker    Thyroid  disease     Past Surgical History:  Procedure Laterality Date   BACK SURGERY  2017   COLONOSCOPY      Family History  Problem Relation Age of Onset   Rheum arthritis Mother    Mitral valve prolapse Mother    Stomach cancer Maternal Grandmother    Colon cancer Neg Hx    Colon polyps Neg Hx    Esophageal cancer Neg Hx    Rectal  cancer Neg Hx     Social History   Socioeconomic History   Marital status: Widowed    Spouse name: Not on file   Number of children: Not on file   Years of education: Not on file   Highest education level: Some college, no degree  Occupational History   Occupation: Retired  Tobacco Use   Smoking status: Every Day    Current packs/day: 0.75    Average packs/day: 0.8 packs/day for 30.0 years (22.5 ttl pk-yrs)    Types: Cigarettes   Smokeless tobacco: Never  Vaping Use   Vaping status: Never Used  Substance and Sexual Activity   Alcohol use: Yes    Alcohol/week: 12.0 standard drinks of alcohol    Types: 12 Cans of beer per week   Drug use: Not Currently   Sexual activity: Yes    Partners: Female  Other Topics Concern   Not on file  Social History Narrative   Not on file   Social Drivers of Health   Tobacco Use: High Risk (03/04/2024)   Patient History    Smoking Tobacco Use: Every Day    Smokeless Tobacco Use: Never    Passive Exposure: Not on file  Financial Resource Strain: Low Risk (10/04/2022)   Overall Financial Resource Strain (CARDIA)  Difficulty of Paying Living Expenses: Not hard at all  Food Insecurity: No Food Insecurity (10/04/2022)   Hunger Vital Sign    Worried About Running Out of Food in the Last Year: Never true    Ran Out of Food in the Last Year: Never true  Transportation Needs: No Transportation Needs (10/04/2022)   PRAPARE - Administrator, Civil Service (Medical): No    Lack of Transportation (Non-Medical): No  Physical Activity: Sufficiently Active (10/04/2022)   Exercise Vital Sign    Days of Exercise per Week: 2 days    Minutes of Exercise per Session: 90 min  Stress: No Stress Concern Present (10/04/2022)   [employer] of Occupational Health - Occupational Stress Questionnaire    Feeling of Stress : Not at all  Social Connections: Moderately Isolated (10/04/2022)   Social Connection and Isolation Panel    Frequency of  Communication with Friends and Family: More than three times a week    Frequency of Social Gatherings with Friends and Family: Twice a week    Attends Religious Services: Never    Database Administrator or Organizations: Yes    Attends Banker Meetings: Never    Marital Status: Widowed  Intimate Partner Violence: Not on file  Depression (PHQ2-9): Low Risk (09/27/2023)   Depression (PHQ2-9)    PHQ-2 Score: 0  Alcohol Screen: Low Risk (10/04/2022)   Alcohol Screen    Last Alcohol Screening Score (AUDIT): 6  Housing: Not on file  Utilities: Not on file  Health Literacy: Not on file    Current Medications[1]  EXAM:  VITALS per patient if applicable: Ht 5' 9 (1.753 m)   Wt 225 lb (102.1 kg)   BMI 33.23 kg/m   GENERAL: alert, oriented, appears well and in no acute distress  HEENT: atraumatic, conjunttiva clear, no obvious abnormalities on inspection of external nose and ears  NECK: normal movements of the head and neck  LUNGS: on inspection no signs of respiratory distress, breathing rate appears normal, no obvious gross SOB, gasping or wheezing  CV: no obvious cyanosis  MS: moves all visible extremities without noticeable abnormality  PSYCH/NEURO: pleasant and cooperative, no obvious depression or anxiety, speech and thought processing grossly intact  ASSESSMENT AND PLAN:  Discussed the following assessment and plan:  Lumbar spondylosis Remains on stable dose of oxycodone .  Will plan to continue this at current strength.   PDMP reviewed.  He is having some increased radicular symptoms and will add prednisone  taper as well.     I discussed the assessment and treatment plan with the patient. The patient was provided an opportunity to ask questions and all were answered. The patient agreed with the plan and demonstrated an understanding of the instructions.   The patient was advised to call back or seek an in-person evaluation if the symptoms worsen or if the  condition fails to improve as anticipated.    Velma Ku, DO      [1]  Current Outpatient Medications:    cyanocobalamin  (VITAMIN B12) 1000 MCG/ML injection, Inject 1,000 mcg into the muscle., Disp: , Rfl:    predniSONE  (STERAPRED UNI-PAK 21 TAB) 10 MG (21) TBPK tablet, Taper as directed on packaging., Disp: 21 tablet, Rfl: 0   albuterol  (VENTOLIN  HFA) 108 (90 Base) MCG/ACT inhaler, USE 2 INHALATIONS EVERY 6 HOURS AS NEEDED FOR WHEEZING OR SHORTNESS OF BREATH, Disp: 18 g, Rfl: 2   fluticasone -salmeterol (WIXELA INHUB) 250-50 MCG/ACT AEPB, Inhale 1 puff into  the lungs in the morning and at bedtime., Disp: 60 each, Rfl: 3   levothyroxine  (SYNTHROID ) 150 MCG tablet, Take 1 tablet (150 mcg total) by mouth daily., Disp: 90 tablet, Rfl: 3   lisinopril  (ZESTRIL ) 20 MG tablet, Take 1 tablet (20 mg total) by mouth daily., Disp: 90 tablet, Rfl: 1   oxyCODONE -acetaminophen  (PERCOCET/ROXICET) 5-325 MG tablet, Take 1 tablet by mouth every 6 (six) hours as needed for severe pain (pain score 7-10)., Disp: 120 tablet, Rfl: 0   rosuvastatin  (CRESTOR ) 40 MG tablet, Take 1 tablet (40 mg total) by mouth daily., Disp: 90 tablet, Rfl: 1   Vitamin D , Ergocalciferol , (DRISDOL ) 1.25 MG (50000 UNIT) CAPS capsule, TAKE 1 CAPSULE EVERY 7 DAYS, Disp: 12 capsule, Rfl: 3  "

## 2024-03-04 NOTE — Assessment & Plan Note (Signed)
 Remains on stable dose of oxycodone .  Will plan to continue this at current strength.   PDMP reviewed.  He is having some increased radicular symptoms and will add prednisone  taper as well.

## 2024-03-09 ENCOUNTER — Other Ambulatory Visit: Payer: Self-pay | Admitting: Family Medicine

## 2024-03-10 NOTE — Telephone Encounter (Signed)
 Rx refill request of B12 injections. Last lab: 04/2023: 445
